# Patient Record
Sex: Male | Born: 1955 | Race: Black or African American | Hispanic: No | Smoking: Current every day smoker
Health system: Southern US, Community
[De-identification: ages and names within clinical notes are randomized; demographics above are authoritative.]

## PROBLEM LIST (undated history)

## (undated) DIAGNOSIS — I10 Essential (primary) hypertension: Secondary | ICD-10-CM

## (undated) DIAGNOSIS — E7800 Pure hypercholesterolemia, unspecified: Secondary | ICD-10-CM

## (undated) DIAGNOSIS — F32A Depression, unspecified: Secondary | ICD-10-CM

## (undated) DIAGNOSIS — I639 Cerebral infarction, unspecified: Secondary | ICD-10-CM

## (undated) DIAGNOSIS — M1990 Unspecified osteoarthritis, unspecified site: Secondary | ICD-10-CM

## (undated) DIAGNOSIS — F329 Major depressive disorder, single episode, unspecified: Secondary | ICD-10-CM

## (undated) HISTORY — PX: KNEE SURGERY: SHX244

---

## 2001-03-15 DIAGNOSIS — I639 Cerebral infarction, unspecified: Secondary | ICD-10-CM

## 2001-03-15 HISTORY — DX: Cerebral infarction, unspecified: I63.9

## 2008-04-18 ENCOUNTER — Emergency Department (HOSPITAL_COMMUNITY): Admission: EM | Admit: 2008-04-18 | Discharge: 2008-04-18 | Payer: Self-pay | Admitting: Emergency Medicine

## 2008-04-30 ENCOUNTER — Ambulatory Visit (HOSPITAL_COMMUNITY): Admission: RE | Admit: 2008-04-30 | Discharge: 2008-04-30 | Payer: Self-pay | Admitting: Orthopaedic Surgery

## 2008-11-23 ENCOUNTER — Emergency Department (HOSPITAL_COMMUNITY): Admission: EM | Admit: 2008-11-23 | Discharge: 2008-11-23 | Payer: Self-pay | Admitting: Emergency Medicine

## 2010-06-30 LAB — COMPREHENSIVE METABOLIC PANEL
ALT: 33 U/L (ref 0–53)
AST: 22 U/L (ref 0–37)
Alkaline Phosphatase: 91 U/L (ref 39–117)
CO2: 28 mEq/L (ref 19–32)
GFR calc non Af Amer: 59 mL/min — ABNORMAL LOW (ref >60)
Glucose, Bld: 110 mg/dL — ABNORMAL HIGH (ref 70–99)
Potassium: 4.4 mEq/L (ref 3.5–5.1)
Sodium: 138 mEq/L (ref 135–145)

## 2010-06-30 LAB — URINALYSIS, ROUTINE W REFLEX MICROSCOPIC
Hgb urine dipstick: NEGATIVE
Ketones, ur: NEGATIVE mg/dL
Protein, ur: NEGATIVE mg/dL
Urobilinogen, UA: 0.2 mg/dL (ref 0.0–1.0)

## 2010-06-30 LAB — CBC
Hemoglobin: 14.4 g/dL (ref 13.0–17.0)
RBC: 4.66 MIL/uL (ref 4.22–5.81)
WBC: 5.6 10*3/uL (ref 4.0–10.5)

## 2010-06-30 LAB — DIFFERENTIAL
Basophils Relative: 1 % (ref 0–1)
Eosinophils Absolute: 0.1 10*3/uL (ref 0.0–0.7)
Eosinophils Relative: 3 % (ref 0–5)
Monocytes Relative: 6 % (ref 3–12)
Neutrophils Relative %: 40 % — ABNORMAL LOW (ref 43–77)

## 2010-07-28 NOTE — H&P (Signed)
NAMEJACQUELINE, Evan Rios               ACCOUNT NO.:  [id number]   MEDICAL RECORD NO.:  [id number]          PATIENT TYPE:  AMB   LOCATION:  DAY                           FACILITY:  [facility]   PHYSICIAN:  J. Darreld Mclean, M.D. DATE OF BIRTH:  08-Apr-1955   DATE OF ADMISSION:  DATE OF DISCHARGE:  LH                              HISTORY & PHYSICAL   CHIEF COMPLAINT:  I got a piece of metal in my knee.   The patient is a 55 year old male who hurt his right knee approximately  3-4 months ago.  He was helping family members cut wood.  They were  using a heavy maul.  As one of his relatives took the maul, he was  sitting at distance away.  They hit a piece of metal when they were  striking the wood and a piece of metal flew off, went over and  penetrated his right knee medially.  He had pain and tenderness.  A  piece of metal went in the knee and has been there since then.  Some  bone has been tender.  At the time he did not seek medical attention.  He thought it would get better, put peroxide on it.  It is moving around  in the leg.  It  hurts.  He came to the hospital on April 18, 2008 and  was seen.  No x-rays were taken and he said there was no infection.  He  asked that I see him.  He has had pain medially.  He would like to have  the piece of metal removed.  It is very painful.   PAST MEDICAL HISTORY:  He has a history of hypertension and history of  stroke and circulatory problems.   Denies any allergies.   He smokes and does not use alcoholic beverages.   MEDICATIONS:  1. He takes aspirin 81 mg daily.  2. Citalopram 20 mg daily.  3. Amlodipine 10 mg daily.  4. Seroquel 200 mg daily.  5. Folic acid daily.  6. Ibuprofen 600 mg milligrams three times a day  7. He also has Vicodin.   PREVIOUS SURGERY:  He had a stroke and had to have surgery for that in  2003 at [facility].  Because of that, he is on disability and has  Medicare.  His father is at [facility].   Hypertension,  diabetes, heart trouble run in the family.  He is divorced and [place]   PHYSICAL EXAM:  Patient is alert, cooperative, oriented.  VITAL SIGNS:  Height is 5 feet 10-1/2, weight is 193 pounds.  Pulse is  80, BP is 100/80, respirations 20, he is afebrile.   HEENT: Negative.  NECK:  Supple.  LUNGS: Clear to P and A.  HEART: Regular without murmur heard.  ABDOMEN:  Soft, nontender without masses.  EXTREMITIES:  His right leg medially, there is a well-healed incision  just below the joint line.  This is an area of tenderness in it feels  like there is a foreign object present near there, but it is  about a  centimeter inferior and slightly posterior to the original incision  line.  Range of motion of the knee is full but tender.  Gait is normal.  Other extremities are negative.  CNS: Intact.  SKIN:  Intact.   IMPRESSION:  Foreign body, right knee area.   I discussed with the patient the removal of this.  Obtained x-rays in  the office, showing the foreign body.  There is no evidence of any  infection.  This will be done in the OR with fluoroscopy control.  I  explained to him the risks and imponderables.  He appears to understand.  Labs pending.                                            ______________________________  Shela Commons. Darreld Mclean, M.D.     JWK/MEDQ  D:  04/24/2008  T:  04/24/2008  Job:  [id number]

## 2010-07-28 NOTE — Op Note (Signed)
NAMEPRESTON, Evan Rios               ACCOUNT NO.:  [id number]   MEDICAL RECORD NO.:  [id number]          PATIENT TYPE:  AMB   LOCATION:  DAY                           FACILITY:  [facility]   PHYSICIAN:  J. Darreld Mclean, M.D. DATE OF BIRTH:  1956-03-06   DATE OF PROCEDURE:  DATE OF DISCHARGE:                               OPERATIVE REPORT   PREOPERATIVE DIAGNOSIS:  Foreign body, right medial knee.   POSTOPERATIVE DIAGNOSIS:  Foreign body, right medial knee.   PROCEDURE:  Removal of foreign body, right medial knee.   ANESTHESIA:  General.   TOURNIQUET TIME:  12 minutes.   SURGEON:  J. Darreld Mclean, MD   DRAINS:  None.   INDICATIONS:  The patient is a 55 year old male who was cutting wood, a  piece of metal was hitten by the wood maul.  A piece of metal entered in  his knee on the medial aspect.  He was seen in the emergency room on  February 4.  The injury actually occurred about 3 or 4 months ago.  He  went to the ER and no x-rays were taken.  When he came to my office few  days later, so he got an x-ray.  There was a piece of metal in the  medial aspect of his knee.  It is slightly movable.  I saw him in the  office on the 10th, we scheduled surgery date as 16th.  This was  earliest time he could obtain a surgery.  Risks and imponderables of the  procedure were discussed.   DESCRIPTION OF PROCEDURE:  The patient identified the right knee as  correct surgical site and he marked where the foreign body was.  I  marked the right knee and put a circle around where we thought the  foreign body was.  He was taken to the operating room, given general  anesthesia while in supine.  Tourniquet was placed and deflated the  right upper thigh.  After generalized time-out, I identified the patient  as Mr. Baranowski.  Identified the right knee as the correct surgical site  and we had all instrumentation being properly positioned.   Leg was elevated and wrapped circumferentially with an Esmarch  bandage.  Tourniquet was inflated to 300 mmHg.  Esmarch bandage was removed.  Incision was made over the lesion.  It appeared that the foreign body  was deep, but appeared to be walled off.  Foreign body was removed.  He  had a deep cover of fibrous tissue around it.  It was opened.  There was  no purulence in the wound.  His foreign body was removed.  It measured  approximately 0.5 cm x 1 cm.  Foreign body will be given to his family  after being cleansed and disinfected.  The wound was reapproximated  after irrigation with saline with 2-0 chromic and then skin staples.  Sterile dressing applied.  Bulky  dressing applied.  Tourniquet deflated after 12 minutes.  The patient  has prescription of Norco 7.5/325 for pain.  I will see him in my office  in approximately 10  days to 2 weeks.  If any difficulties, always  contact me through the office hospital beeper system.           ______________________________  Shela Commons. Darreld Mclean, M.D.     JWK/MEDQ  D:  04/30/2008  T:  04/30/2008  Job:  [id number]

## 2011-11-23 ENCOUNTER — Emergency Department (HOSPITAL_COMMUNITY)
Admission: EM | Admit: 2011-11-23 | Discharge: 2011-11-23 | Disposition: A | Discharge status: Home / Self Care | Payer: Medicare Other | Attending: Emergency Medicine | Admitting: Emergency Medicine

## 2011-11-23 ENCOUNTER — Emergency Department (HOSPITAL_COMMUNITY): Payer: Medicare Other

## 2011-11-23 ENCOUNTER — Encounter (HOSPITAL_COMMUNITY): Payer: Self-pay | Admitting: *Deleted

## 2011-11-23 DIAGNOSIS — M25562 Pain in left knee: Secondary | ICD-10-CM

## 2011-11-23 DIAGNOSIS — M25462 Effusion, left knee: Secondary | ICD-10-CM

## 2011-11-23 DIAGNOSIS — M25469 Effusion, unspecified knee: Secondary | ICD-10-CM | POA: Insufficient documentation

## 2011-11-23 DIAGNOSIS — M25569 Pain in unspecified knee: Secondary | ICD-10-CM | POA: Insufficient documentation

## 2011-11-23 HISTORY — DX: Essential (primary) hypertension: I10

## 2011-11-23 HISTORY — DX: Major depressive disorder, single episode, unspecified: F32.9

## 2011-11-23 HISTORY — DX: Depression, unspecified: F32.A

## 2011-11-23 MED ORDER — BUPIVACAINE HCL (PF) 0.5 % IJ SOLN
10.0000 mL | Freq: Once | INTRAMUSCULAR | Status: DC
  Filled 2011-11-23: qty 30

## 2011-11-23 MED ORDER — HYDROCODONE-ACETAMINOPHEN 5-500 MG PO TABS: 1.0000 | ORAL_TABLET | Freq: Four times a day (QID) | ORAL | Status: AC | PRN

## 2011-11-23 MED ORDER — NAPROXEN 500 MG PO TABS: 500.0000 mg | ORAL_TABLET | Freq: Two times a day (BID) | ORAL | Status: DC | PRN

## 2011-11-23 MED ORDER — HYDROCODONE-ACETAMINOPHEN 5-325 MG PO TABS
2.0000 | ORAL_TABLET | Freq: Once | ORAL | Status: AC
  Administered 2011-11-23: 2 via ORAL
  Filled 2011-11-23: qty 2

## 2011-11-23 MED ORDER — IBUPROFEN 800 MG PO TABS
800.0000 mg | ORAL_TABLET | Freq: Once | ORAL | Status: AC
  Administered 2011-11-23: 800 mg via ORAL
  Filled 2011-11-23: qty 1

## 2011-11-23 NOTE — ED Provider Notes (Signed)
History  This chart was scribed for Evan Razor, MD by Erskine Emery. This patient was seen in room APFT24/APFT24 and the patient's care was started at 17:55.   CSN: [id number]  Arrival date & time 11/23/11  1651   First MD Initiated Contact with Patient 11/23/11 1755      Chief Complaint  Patient presents with  . Knee Pain    (Consider location/radiation/quality/duration/timing/severity/associated sxs/prior treatment) The history is provided by the patient. No language interpreter was used.  Evan Rios is a 56 y.o. male who presents to the Emergency Department complaining of gradually worsening constant left knee pain that shoots through the calf, aggravated by walking or standing, since waking this morning. Pt reports he was fine before going to bed. Pt has a h/o surgery in that knee 2 years ago for torn cartilage. Pt denies any fevers or chills but reports the knee seems swollen. Pt reports the only time he has had knee pain this bad was before the surgery. Pt denies any injury or recent activity changes. Pt has no h/o gout and has NKDA.   Past Medical History  Diagnosis Date  . Hypertension   . Depression     Past Surgical History  Procedure Date  . Knee surgery     No family history on file.  History  Substance Use Topics  . Smoking status: Current Everyday Smoker  . Smokeless tobacco: Not on file  . Alcohol Use: No      Review of Systems  Constitutional: Negative for fever and chills.  Respiratory: Negative for shortness of breath.   Gastrointestinal: Negative for nausea and vomiting.  Musculoskeletal: Positive for joint swelling.       Left knee pain  Neurological: Negative for weakness.    Allergies  Bee venom and Oxycodone  Home Medications   Current Outpatient Rx  Name Route Sig Dispense Refill  . AMLODIPINE BESY-BENAZEPRIL HCL 10-20 MG PO CAPS Oral Take 1 capsule by mouth daily.    Marland Kitchen CITALOPRAM HYDROBROMIDE 20 MG PO TABS Oral Take 20 mg by  mouth daily.      BP 150/83  Pulse 92  Temp 98.7 F (37.1 C)  Resp 18  Ht 5\' 11"  (1.803 m)  Wt 220 lb (99.791 kg)  BMI 30.68 kg/m2  SpO2 97%  Physical Exam  Nursing note and vitals reviewed. Constitutional: He is oriented to person, place, and time. He appears well-developed and well-nourished. No distress.  HENT:  Head: Normocephalic and atraumatic.  Eyes: EOM are normal.  Neck: Neck supple. No tracheal deviation present.  Cardiovascular: Normal rate.   Pulmonary/Chest: Effort normal. No respiratory distress.  Musculoskeletal: Normal range of motion. He exhibits tenderness. He exhibits no edema.       Left knee is normal in appearance and symmetric compared with the right knee. No effusion appreciated.  Pain with manipulation of patella. Tenderness in popliteal fossa. Neurovascular intact distally.  Neurological: He is alert and oriented to person, place, and time.  Skin: Skin is warm and dry. No rash noted. No erythema.  Psychiatric: He has a normal mood and affect. His behavior is normal.    ED Course  Procedures (including critical care time) DIAGNOSTIC STUDIES: Oxygen Saturation is 97% on room air, adequate by my interpretation.    COORDINATION OF CARE: 18:04--I evaluated the patient and we discussed a treatment plan including pain medication to which the pt agreed. The pt requested pills, not pain medication injection.   Labs Reviewed - No data  to display No results found.   1. Knee pain, left   2. Knee effusion, left       MDM  56yM male with atraumatic left knee pain. Patient has no effusion. I wanted to This. Patient is refusing. I discussed with him that I cannot adequately evaluate for potential etiologies of this, the worst of which would be a septic joint. Her stance the benefit of this evaluation. Understands risk of not undergoing this procedure. Will treat him symptomatically at this time. Return precautions were discussed.      I personally  preformed the services scribed in my presence. The recorded information has been reviewed and considered. Evan Razor, MD. }   Evan Razor, MD 11/30/11 [phone]

## 2011-11-23 NOTE — ED Notes (Signed)
Pt c/o pain to left knee, states that he noticed that his left knee felt "achey" over the past few days but the pain was worse after he woke up this afternoon, denies any injury.

## 2011-11-23 NOTE — ED Notes (Signed)
MD at bedside. 

## 2012-03-17 ENCOUNTER — Emergency Department (HOSPITAL_COMMUNITY): Payer: Medicare Other

## 2012-03-17 ENCOUNTER — Emergency Department (HOSPITAL_COMMUNITY)
Admission: EM | Admit: 2012-03-17 | Discharge: 2012-03-17 | Disposition: A | Discharge status: Home / Self Care | Payer: Medicare Other | Attending: Emergency Medicine | Admitting: Emergency Medicine

## 2012-03-17 ENCOUNTER — Encounter (HOSPITAL_COMMUNITY): Payer: Self-pay

## 2012-03-17 DIAGNOSIS — R1310 Dysphagia, unspecified: Secondary | ICD-10-CM | POA: Insufficient documentation

## 2012-03-17 DIAGNOSIS — F3289 Other specified depressive episodes: Secondary | ICD-10-CM | POA: Insufficient documentation

## 2012-03-17 DIAGNOSIS — K047 Periapical abscess without sinus: Secondary | ICD-10-CM | POA: Insufficient documentation

## 2012-03-17 DIAGNOSIS — F17200 Nicotine dependence, unspecified, uncomplicated: Secondary | ICD-10-CM | POA: Insufficient documentation

## 2012-03-17 DIAGNOSIS — F329 Major depressive disorder, single episode, unspecified: Secondary | ICD-10-CM | POA: Insufficient documentation

## 2012-03-17 DIAGNOSIS — I10 Essential (primary) hypertension: Secondary | ICD-10-CM | POA: Insufficient documentation

## 2012-03-17 DIAGNOSIS — K122 Cellulitis and abscess of mouth: Secondary | ICD-10-CM

## 2012-03-17 DIAGNOSIS — Z79899 Other long term (current) drug therapy: Secondary | ICD-10-CM | POA: Insufficient documentation

## 2012-03-17 LAB — CBC WITH DIFFERENTIAL/PLATELET
Basophils Absolute: 0 10*3/uL (ref 0.0–0.1)
Basophils Relative: 0 % (ref 0–1)
Eosinophils Absolute: 0.1 10*3/uL (ref 0.0–0.7)
Eosinophils Relative: 2 % (ref 0–5)
HCT: 42 % (ref 39.0–52.0)
Lymphocytes Relative: 46 % (ref 12–46)
MCH: 30.3 pg (ref 26.0–34.0)
MCHC: 34.5 g/dL (ref 30.0–36.0)
MCV: 87.7 fL (ref 78.0–100.0)
Monocytes Absolute: 0.7 10*3/uL (ref 0.1–1.0)
Platelets: 367 10*3/uL (ref 150–400)
RDW: 13 % (ref 11.5–15.5)
WBC: 9.4 10*3/uL (ref 4.0–10.5)

## 2012-03-17 LAB — BASIC METABOLIC PANEL
Calcium: 9.6 mg/dL (ref 8.4–10.5)
Creatinine, Ser: 1.07 mg/dL (ref 0.50–1.35)
GFR calc Af Amer: 88 mL/min — ABNORMAL LOW (ref >90)
GFR calc non Af Amer: 76 mL/min — ABNORMAL LOW (ref >90)
Sodium: 137 mEq/L (ref 135–145)

## 2012-03-17 LAB — RAPID STREP SCREEN (MED CTR MEBANE ONLY): Streptococcus, Group A Screen (Direct): NEGATIVE

## 2012-03-17 MED ORDER — AMOXICILLIN 500 MG PO CAPS: 500.0000 mg | ORAL_CAPSULE | Freq: Three times a day (TID) | ORAL | Status: DC

## 2012-03-17 MED ORDER — DEXAMETHASONE SODIUM PHOSPHATE 4 MG/ML IJ SOLN
10.0000 mg | Freq: Once | INTRAMUSCULAR | Status: AC
  Administered 2012-03-17: 10 mg via INTRAVENOUS
  Filled 2012-03-17: qty 3

## 2012-03-17 MED ORDER — IOHEXOL 300 MG/ML  SOLN
80.0000 mL | Freq: Once | INTRAMUSCULAR | Status: AC | PRN
  Administered 2012-03-17: 80 mL via INTRAVENOUS

## 2012-03-17 MED ORDER — SODIUM CHLORIDE 0.9 % IV BOLUS (SEPSIS)
1000.0000 mL | Freq: Once | INTRAVENOUS | Status: AC
  Administered 2012-03-17: 1000 mL via INTRAVENOUS

## 2012-03-17 MED ORDER — SODIUM CHLORIDE 0.9 % IV SOLN
3.0000 g | Freq: Once | INTRAVENOUS | Status: AC
  Administered 2012-03-17: 3 g via INTRAVENOUS
  Filled 2012-03-17: qty 3

## 2012-03-17 MED ORDER — HYDROCODONE-ACETAMINOPHEN 7.5-500 MG/15ML PO SOLN: 15.0000 mL | Freq: Four times a day (QID) | ORAL | Status: DC | PRN

## 2012-03-17 NOTE — ED Notes (Signed)
Pt tolerated orange juice well.

## 2012-03-17 NOTE — ED Notes (Signed)
Pt states he awoke this am with feeling of difficulty swallowing, also throat is sore now.   No resp diff.

## 2012-03-17 NOTE — ED Provider Notes (Signed)
History   This chart was scribed for Glynn Octave, MD by Gerlean Ren, ED Scribe. This patient was seen in room APA11/APA11 and the patient's care was started at 9:04 PM    CSN: [id number]  Arrival date & time 03/17/12  2048   First MD Initiated Contact with Patient 03/17/12 2056      Chief Complaint  Patient presents with  . Sore Throat  . Dysphagia     The history is provided by the patient. No language interpreter was used.   Evan Rios is a 57 y.o. male with h/o HTN and depression who presents to the Emergency Department complaining of a constant sore throat with sudden onset this morning.  Pt suddenly woke up choking on phlegm but was able to catch his breath after sitting up and spitting out phlegm, which contained a small amount of blood.  Pt states he has been gagging up phlegm throughout the day that causes temporary dyspnea.  Pt states he was able to eat and drink regularly today.  Pt denies abdominal pain, dental pain.  Pt states he is currently on amoxicillin for a dental abscess identified one week ago.  Pt is a current everyday smoker but denies alcohol use.   PCP at [facility]. Past Medical History  Diagnosis Date  . Hypertension   . Depression     Past Surgical History  Procedure Date  . Knee surgery     No family history on file.  History  Substance Use Topics  . Smoking status: Current Every Day Smoker  . Smokeless tobacco: Not on file  . Alcohol Use: No      Review of Systems A complete 10 system review of systems was obtained and all systems are negative except as noted in the HPI and PMH.  Allergies  Bee venom and Oxycodone  Home Medications   Current Outpatient Rx  Name  Route  Sig  Dispense  Refill  . AMLODIPINE BESY-BENAZEPRIL HCL 10-20 MG PO CAPS   Oral   Take 1 capsule by mouth daily.         Marland Kitchen CITALOPRAM HYDROBROMIDE 20 MG PO TABS   Oral   Take 20 mg by mouth daily.         Marland Kitchen NAPROXEN 500 MG PO TABS   Oral   Take 1 tablet  (500 mg total) by mouth 2 (two) times daily as needed.   20 tablet   0     BP 178/107  Pulse 118  Temp 98 F (36.7 C) (Oral)  Resp 20  Ht 5\' 11"  (1.803 m)  Wt 205 lb (92.987 kg)  BMI 28.59 kg/m2  SpO2 98%  Physical Exam  Nursing note and vitals reviewed. Constitutional: He is oriented to person, place, and time. He appears well-developed and well-nourished. No distress.  HENT:  Head: Normocephalic and atraumatic.       Hoarse voice, bilateral tonsillar erythema and swelling right greater than left, no soft palette swelling, uvula midline, no trismus, no drooling,   Eyes: Conjunctivae normal are normal.  Neck: No tracheal deviation present.       Shotty cervical lymphadenopathy,  Cardiovascular: Normal rate, regular rhythm and normal heart sounds.   No murmur heard. Pulmonary/Chest: Effort normal and breath sounds normal. No respiratory distress. He has no wheezes.  Abdominal: Soft. Bowel sounds are normal. There is no tenderness.  Musculoskeletal: Normal range of motion.  Neurological: He is alert and oriented to person, place, and time.  CN 2-12 intact, smile symmetric, 5/5 strength throughout, no ataxia on finger to nose.  Skin: Skin is warm and dry.  Psychiatric: He has a normal mood and affect. His behavior is normal.    ED Course  Procedures (including critical care time) DIAGNOSTIC STUDIES: Oxygen Saturation is 98% on room air, normal by my interpretation.    COORDINATION OF CARE: 9:09 PM- Patient informed of clinical course, understands medical decision-making process, and agrees with plan.  Ordered IV fluids, IV Unasyn, IV decadron, CBC, b-met, and soft tissue neck CT with contrast. 10:41 PM- Re-check, informed pt of negative XRs and that pharyngitis is most likely viral.   Informed pt to return if condition worsens or if drooling occurs.    Results for orders placed during the hospital encounter of 03/17/12  CBC WITH DIFFERENTIAL      Component Value Range    WBC 9.4  4.0 - 10.5 K/uL   RBC 4.79  4.22 - 5.81 MIL/uL   Hemoglobin 14.5  13.0 - 17.0 g/dL   HCT 14.7  82.9 - 56.2 %   MCV 87.7  78.0 - 100.0 fL   MCH 30.3  26.0 - 34.0 pg   MCHC 34.5  30.0 - 36.0 g/dL   RDW 13.0  86.5 - 78.4 %   Platelets 367  150 - 400 K/uL   Neutrophils Relative 44  43 - 77 %   Neutro Abs 4.1  1.7 - 7.7 K/uL   Lymphocytes Relative 46  12 - 46 %   Lymphs Abs 4.4 (*) 0.7 - 4.0 K/uL   Monocytes Relative 8  3 - 12 %   Monocytes Absolute 0.7  0.1 - 1.0 K/uL   Eosinophils Relative 2  0 - 5 %   Eosinophils Absolute 0.1  0.0 - 0.7 K/uL   Basophils Relative 0  0 - 1 %   Basophils Absolute 0.0  0.0 - 0.1 K/uL  BASIC METABOLIC PANEL      Component Value Range   Sodium 137  135 - 145 mEq/L   Potassium 3.6  3.5 - 5.1 mEq/L   Chloride 101  96 - 112 mEq/L   CO2 25  19 - 32 mEq/L   Glucose, Bld 137 (*) 70 - 99 mg/dL   BUN 14  6 - 23 mg/dL   Creatinine, Ser 6.96  0.50 - 1.35 mg/dL   Calcium 9.6  8.4 - 29.5 mg/dL   GFR calc non Af Amer 76 (*) >90 mL/min   GFR calc Af Amer 88 (*) >90 mL/min  RAPID STREP SCREEN      Component Value Range   Streptococcus, Group A Screen (Direct) NEGATIVE  NEGATIVE    Dg Neck Soft Tissue  03/17/2012  *RADIOLOGY REPORT*  Clinical Data: Smoker with dysphagia.  NECK SOFT TISSUES - 1+ VIEW  Comparison: None.  Findings: Bilateral carotid artery calcifications.  Posterior soft tissue calcification.  Mild cervical spine degenerative changes and mild reversal of the normal cervical lordosis with mild dextroconvex scoliosis.  Normal appearing epiglottis and subglottic airway.  No prevertebral soft tissue swelling.  Possible enlargement of the lingual tonsils.  IMPRESSION:  1.  Possible lingual tonsillar enlargement.  Correlation with direct visualization is recommended. 2.  Bilateral carotid artery atheromatous calcifications.   Original Report Authenticated By: Beckie Salts, M.D.    Dg Chest 2 View  03/17/2012  *RADIOLOGY REPORT*  Clinical Data: Sore  throat, dysphagia, pain with swallowing, history hypertension, smoking  CHEST - 2 VIEW  Comparison: None.  Findings: Normal heart size, mediastinal contours, and pulmonary vascularity. Bibasilar opacities on the PA view, not well visualized on lateral view, suspect mild bibasilar atelectasis. No definite infiltrate, pleural effusion or pneumothorax. Bones unremarkable.  IMPRESSION: Probable bibasilar atelectasis.   Original Report Authenticated By: Ulyses Southward, M.D.    Ct Soft Tissue Neck W Contrast  03/17/2012  *RADIOLOGY REPORT*  Clinical Data: Pharyngitis and dysphagia.  Smoker  CT NECK WITH CONTRAST  Technique:  Multidetector CT imaging of the neck was performed with intravenous contrast.  Contrast: 80mL OMNIPAQUE IOHEXOL 300 MG/ML  SOLN  Comparison: Soft tissue neck radiographs obtained earlier today.  Findings: Normal appearing prevertebral soft tissues and airway. The epiglottis has a normal appearance.  No masses, enlarged lymph nodes or fluid collections.  Mild cervical spine degenerative changes and reversal of the normal cervical lordosis.  Previously noted bilateral carotid artery calcifications.  Bullous changes at both lung apices.  IMPRESSION:  1.  No acute abnormality. 2.  Bilateral carotid artery atheromatous calcifications. 3.  COPD.   Original Report Authenticated By: Beckie Salts, M.D.      No diagnosis found.    MDM  Awoke this morning with "feeling of choking" and sore throat. Worse with laying down, better with sitting up. No drooling difficulty breathing or swallowing. No fever.  Erythematous oropharynx with enlarged tonsils R>L.  No exudates. Uvula midline and edematous. Imaging negative for peritonsillar abscess. Will treat for uvulitis.  Tolerating PO in ED.  I personally performed the services described in this documentation, which was scribed in my presence. The recorded information has been reviewed and is accurate.   Glynn Octave, MD 03/17/12 [phone]

## 2012-03-17 NOTE — ED Notes (Signed)
Visualized pt to have swollen and red tonsils worse in his right.

## 2012-03-17 NOTE — ED Notes (Signed)
Pt presents with sore throat and dysphagia since this morning. Pt states pain in throat and swallowing has worsened throughout the day. Pt denies fever. NAD noted at this time.

## 2013-01-17 ENCOUNTER — Emergency Department (HOSPITAL_COMMUNITY)
Admission: EM | Admit: 2013-01-17 | Discharge: 2013-01-17 | Disposition: A | Discharge status: Home / Self Care | Payer: Medicare Other | Attending: Emergency Medicine | Admitting: Emergency Medicine

## 2013-01-17 ENCOUNTER — Encounter (HOSPITAL_COMMUNITY): Payer: Self-pay | Admitting: Emergency Medicine

## 2013-01-17 DIAGNOSIS — Z79899 Other long term (current) drug therapy: Secondary | ICD-10-CM | POA: Insufficient documentation

## 2013-01-17 DIAGNOSIS — K089 Disorder of teeth and supporting structures, unspecified: Secondary | ICD-10-CM | POA: Insufficient documentation

## 2013-01-17 DIAGNOSIS — E7800 Pure hypercholesterolemia, unspecified: Secondary | ICD-10-CM | POA: Insufficient documentation

## 2013-01-17 DIAGNOSIS — K029 Dental caries, unspecified: Secondary | ICD-10-CM | POA: Insufficient documentation

## 2013-01-17 DIAGNOSIS — K0889 Other specified disorders of teeth and supporting structures: Secondary | ICD-10-CM

## 2013-01-17 DIAGNOSIS — F3289 Other specified depressive episodes: Secondary | ICD-10-CM | POA: Insufficient documentation

## 2013-01-17 DIAGNOSIS — I10 Essential (primary) hypertension: Secondary | ICD-10-CM | POA: Insufficient documentation

## 2013-01-17 DIAGNOSIS — F329 Major depressive disorder, single episode, unspecified: Secondary | ICD-10-CM | POA: Insufficient documentation

## 2013-01-17 DIAGNOSIS — Z8673 Personal history of transient ischemic attack (TIA), and cerebral infarction without residual deficits: Secondary | ICD-10-CM | POA: Insufficient documentation

## 2013-01-17 DIAGNOSIS — F17200 Nicotine dependence, unspecified, uncomplicated: Secondary | ICD-10-CM | POA: Insufficient documentation

## 2013-01-17 HISTORY — DX: Pure hypercholesterolemia, unspecified: E78.00

## 2013-01-17 HISTORY — DX: Cerebral infarction, unspecified: I63.9

## 2013-01-17 MED ORDER — AMOXICILLIN 500 MG PO CAPS: 500.0000 mg | ORAL_CAPSULE | Freq: Three times a day (TID) | ORAL | Status: DC

## 2013-01-17 MED ORDER — TRAMADOL HCL 50 MG PO TABS: 50.0000 mg | ORAL_TABLET | Freq: Four times a day (QID) | ORAL | Status: DC | PRN

## 2013-01-17 NOTE — ED Provider Notes (Signed)
CSN: [id number]     Arrival date & time 01/17/13  1428 History   First MD Initiated Contact with Patient 01/17/13 1515     Chief Complaint  Patient presents with  . Dental Pain   (Consider location/radiation/quality/duration/timing/severity/associated sxs/prior Treatment) Patient is a 57 y.o. male presenting with tooth pain. The history is provided by the patient.  Dental Pain Location:  Lower Lower teeth location:  29/RL 2nd bicuspid, 30/RL 1st molar and 31/RL 2nd molar Quality:  Sharp and shooting Severity:  Severe Onset quality:  Gradual Duration:  2 days Timing:  Constant Progression:  Worsening Chronicity:  New Context: dental caries and poor dentition   Context: not recent dental surgery and not trauma   Relieved by:  Heat Worsened by:  Cold food/drink Ineffective treatments:  NSAIDs Associated symptoms: facial pain   Associated symptoms: no congestion, no difficulty swallowing, no drooling, no facial swelling, no fever, no gum swelling, no headaches, no neck pain, no neck swelling and no trismus   Risk factors: lack of dental care, periodontal disease and smoking     Past Medical History  Diagnosis Date  . Hypertension   . Depression   . Hypercholesteremia   . Stroke    Past Surgical History  Procedure Laterality Date  . Knee surgery     Family History  Problem Relation Age of Onset  . Stroke Mother   . Cancer Mother   . Hypertension Father    History  Substance Use Topics  . Smoking status: Current Every Day Smoker -- 1.00 packs/day for 41 years    Types: Cigarettes  . Smokeless tobacco: Former Neurosurgeon  . Alcohol Use: Yes     Comment: occas    Review of Systems  Constitutional: Negative for fever and appetite change.  HENT: Positive for dental problem. Negative for congestion, drooling, facial swelling, sore throat and trouble swallowing.   Eyes: Negative for pain and visual disturbance.  Musculoskeletal: Negative for neck pain and neck stiffness.   Neurological: Negative for dizziness, facial asymmetry and headaches.  Hematological: Negative for adenopathy.  All other systems reviewed and are negative.    Allergies  Bee venom and Oxycodone  Home Medications   Current Outpatient Rx  Name  Route  Sig  Dispense  Refill  . amLODipine-benazepril (LOTREL) 10-20 MG per capsule   Oral   Take 1 capsule by mouth daily.         . citalopram (CELEXA) 20 MG tablet   Oral   Take 20 mg by mouth daily.         . pravastatin (PRAVACHOL) 40 MG tablet   Oral   Take 40 mg by mouth daily.         . traZODone (DESYREL) 100 MG tablet   Oral   Take 100 mg by mouth at bedtime.          BP 140/77  Pulse 95  Temp(Src) 98.4 F (36.9 C) (Oral)  Resp 16  Ht 5\' 11"  (1.803 m)  Wt 205 lb (92.987 kg)  BMI 28.60 kg/m2  SpO2 99% Physical Exam  Nursing note and vitals reviewed. Constitutional: He is oriented to person, place, and time. He appears well-developed and well-nourished. No distress.  HENT:  Head: Normocephalic and atraumatic.  Right Ear: Tympanic membrane and ear canal normal.  Left Ear: Tympanic membrane and ear canal normal.  Mouth/Throat: Uvula is midline, oropharynx is clear and moist and mucous membranes are normal. No trismus in the jaw. Dental caries  present. No dental abscesses or uvula swelling.  Dental caries of the left lower molars and second biscuspid.  No facial swelling, obvious dental abscess, trismus, or sublingual abnml.    Neck: Normal range of motion. Neck supple.  Cardiovascular: Normal rate, regular rhythm and normal heart sounds.   No murmur heard. Pulmonary/Chest: Effort normal and breath sounds normal. No respiratory distress.  Musculoskeletal: Normal range of motion.  Lymphadenopathy:    He has no cervical adenopathy.  Neurological: He is alert and oriented to person, place, and time. He exhibits normal muscle tone. Coordination normal.  Skin: Skin is warm and dry.    ED Course  Procedures  (including critical care time) Labs Review Labs Reviewed - No data to display Imaging Review No results found.  EKG Interpretation   None       MDM    Multiple dental caries with tenderness to palpation along the second left lower premolar and first and second molars.  No concerning sx's for Ludwig's angina.  Pt appears stable for discharge.  Referral info given for local dentists  Chieko Neises L. Trisha Mangle, PA-C 01/18/13 2239

## 2013-01-17 NOTE — ED Notes (Signed)
Pt c/o R lower jaw dental pain.

## 2013-01-23 NOTE — ED Provider Notes (Signed)
Medical screening examination/treatment/procedure(s) were performed by non-physician practitioner and as supervising physician I was immediately available for consultation/collaboration.  EKG Interpretation   None        Oswaldo Cueto M Emalene Welte, MD 01/23/13 0905 

## 2013-08-15 ENCOUNTER — Encounter (HOSPITAL_COMMUNITY): Payer: Self-pay | Admitting: Emergency Medicine

## 2013-08-15 ENCOUNTER — Emergency Department (HOSPITAL_COMMUNITY)
Admission: EM | Admit: 2013-08-15 | Discharge: 2013-08-15 | Disposition: A | Discharge status: Home / Self Care | Payer: Medicare Other | Attending: Emergency Medicine | Admitting: Emergency Medicine

## 2013-08-15 DIAGNOSIS — R109 Unspecified abdominal pain: Secondary | ICD-10-CM | POA: Insufficient documentation

## 2013-08-15 DIAGNOSIS — Z7982 Long term (current) use of aspirin: Secondary | ICD-10-CM | POA: Insufficient documentation

## 2013-08-15 DIAGNOSIS — F329 Major depressive disorder, single episode, unspecified: Secondary | ICD-10-CM | POA: Insufficient documentation

## 2013-08-15 DIAGNOSIS — F3289 Other specified depressive episodes: Secondary | ICD-10-CM | POA: Insufficient documentation

## 2013-08-15 DIAGNOSIS — Z8673 Personal history of transient ischemic attack (TIA), and cerebral infarction without residual deficits: Secondary | ICD-10-CM | POA: Insufficient documentation

## 2013-08-15 DIAGNOSIS — E7800 Pure hypercholesterolemia, unspecified: Secondary | ICD-10-CM | POA: Insufficient documentation

## 2013-08-15 DIAGNOSIS — F17200 Nicotine dependence, unspecified, uncomplicated: Secondary | ICD-10-CM | POA: Insufficient documentation

## 2013-08-15 DIAGNOSIS — Z79899 Other long term (current) drug therapy: Secondary | ICD-10-CM | POA: Insufficient documentation

## 2013-08-15 DIAGNOSIS — I10 Essential (primary) hypertension: Secondary | ICD-10-CM | POA: Insufficient documentation

## 2013-08-15 DIAGNOSIS — R1030 Lower abdominal pain, unspecified: Secondary | ICD-10-CM

## 2013-08-15 MED ORDER — NAPROXEN 500 MG PO TABS: 500.0000 mg | ORAL_TABLET | Freq: Two times a day (BID) | ORAL | Status: AC

## 2013-08-15 NOTE — ED Notes (Signed)
Pt states rt side of groin hurting x 3 weeks, states pain is worse today, pt states the area feels tight. Pt has been taken naproxen for pain.

## 2013-08-15 NOTE — ED Notes (Signed)
PT c/o right groin pain x3 weeks unrelieved by naproxen OTC medication.

## 2013-08-15 NOTE — ED Notes (Signed)
MD at bedside. 

## 2013-08-15 NOTE — ED Provider Notes (Signed)
CSN: [id number]     Arrival date & time 08/15/13  1416 History  This chart was scribed for Maudry Diego, MD by Eston Mould, ED Scribe. This patient was seen in room APA08/APA08 and the patient's care was started at [facility].    Chief Complaint  Patient presents with  . Groin Pain    Patient is a 58 y.o. male presenting with groin pain. The history is provided by the patient. No language interpreter was used.  Groin Pain This is a new problem. The current episode started more than 1 week ago. The problem occurs constantly. The problem has not changed since onset.Pertinent negatives include no chest pain, no abdominal pain and no headaches. Nothing aggravates the symptoms. Nothing relieves the symptoms. He has tried nothing for the symptoms. The treatment provided no relief.   HPI Comments: Evan Rios is a 58 y.o. male who presents to the Emergency Department complaining of upper groin pain x 2-3 weeks. States he was on Pravastatin and was changed to Lipitor; states his groin pain began then. Has intermittent pain at this time. PCP: [facility] in [place], [place]. Is allergic to Oxycodone. Denies fever, chills, and cough.  Past Medical History  Diagnosis Date  . Hypertension   . Depression   . Hypercholesteremia   . Stroke    Past Surgical History  Procedure Laterality Date  . Knee surgery     Family History  Problem Relation Age of Onset  . Stroke Mother   . Cancer Mother   . Hypertension Father    History  Substance Use Topics  . Smoking status: Current Every Day Smoker -- 1.00 packs/day for 41 years    Types: Cigarettes  . Smokeless tobacco: Former Systems developer  . Alcohol Use: Yes     Comment: occas    Review of Systems  Constitutional: Negative for chills, appetite change and fatigue.  HENT: Negative for congestion, ear discharge and sinus pressure.   Eyes: Negative for discharge.  Respiratory: Negative for cough.   Cardiovascular: Negative for chest  pain.  Gastrointestinal: Negative for abdominal pain and diarrhea.  Genitourinary: Negative for frequency and hematuria.  Musculoskeletal: Negative for back pain.  Skin: Negative for rash.  Neurological: Negative for seizures and headaches.  Psychiatric/Behavioral: Negative for hallucinations.   Allergies  Bee venom and Oxycodone  Home Medications   Prior to Admission medications   Medication Sig Start Date End Date Taking? Authorizing Provider  aspirin EC 81 MG tablet Take 81 mg by mouth daily.   Yes Historical Provider, MD  atorvastatin (LIPITOR) 40 MG tablet Take 40 mg by mouth daily.  08/07/13  Yes Historical Provider, MD  amLODipine-benazepril (LOTREL) 10-20 MG per capsule Take 1 capsule by mouth daily.    Historical Provider, MD  citalopram (CELEXA) 20 MG tablet Take 20 mg by mouth daily.    Historical Provider, MD  traZODone (DESYREL) 100 MG tablet Take 100 mg by mouth at bedtime.    Historical Provider, MD   BP 155/88  Pulse 87  Temp(Src) 97.9 F (36.6 C) (Oral)  Resp 18  Ht 5\' 11"  (1.803 m)  Wt 217 lb (98.431 kg)  BMI 30.28 kg/m2  SpO2 97%  Physical Exam  Constitutional: He is oriented to person, place, and time. He appears well-developed.  HENT:  Head: Normocephalic.  Eyes: Conjunctivae and EOM are normal. No scleral icterus.  Neck: Neck supple. No thyromegaly present.  Cardiovascular: Normal rate and regular rhythm.  Exam reveals no gallop  and no friction rub.   No murmur heard. Pulmonary/Chest: No stridor. He has no wheezes. He has no rales. He exhibits no tenderness.  Abdominal: He exhibits no distension. There is no tenderness. There is no rebound.  Genitourinary:  Milder tenderness to R inguinal area.  Musculoskeletal: Normal range of motion. He exhibits no edema.  Lymphadenopathy:    He has no cervical adenopathy.  Neurological: He is oriented to person, place, and time. He exhibits normal muscle tone. Coordination normal.  Skin: No rash noted. No  erythema.  Psychiatric: He has a normal mood and affect. His behavior is normal.   ED Course  Procedures DIAGNOSTIC STUDIES: Oxygen Saturation is 97% on RA, normal by my interpretation.    COORDINATION OF CARE: 3:28 PM-Discussed treatment plan which includes refer pt to surgeon to r/o hernia and will discharge pt with medication. Pt agreed to plan.   Labs Review Labs Reviewed - No data to display  Imaging Review No results found.   EKG Interpretation None      MDM   Final diagnoses:  None    The chart was scribed for me under my direct supervision.  I personally performed the history, physical, and medical decision making and all procedures in the evaluation of this patient.Maudry Diego, MD 08/15/13 [phone]

## 2013-08-15 NOTE — Discharge Instructions (Signed)
Follow up with Dr. Arnoldo Morale in 1-2 weeks to recheck your groin

## 2014-01-14 ENCOUNTER — Other Ambulatory Visit (HOSPITAL_COMMUNITY): Payer: Self-pay | Admitting: Physician Assistant

## 2014-01-14 DIAGNOSIS — R945 Abnormal results of liver function studies: Secondary | ICD-10-CM

## 2014-01-17 ENCOUNTER — Ambulatory Visit (HOSPITAL_COMMUNITY)
Admission: RE | Admit: 2014-01-17 | Discharge: 2014-01-17 | Disposition: A | Discharge status: Home / Self Care | Payer: Medicare Other | Source: Ambulatory Visit | Attending: Physician Assistant | Admitting: Physician Assistant

## 2014-01-17 DIAGNOSIS — K769 Liver disease, unspecified: Secondary | ICD-10-CM | POA: Insufficient documentation

## 2014-01-17 DIAGNOSIS — R945 Abnormal results of liver function studies: Secondary | ICD-10-CM

## 2014-01-17 DIAGNOSIS — R7989 Other specified abnormal findings of blood chemistry: Secondary | ICD-10-CM | POA: Diagnosis not present

## 2014-02-11 ENCOUNTER — Other Ambulatory Visit (HOSPITAL_COMMUNITY): Payer: Self-pay | Admitting: Physician Assistant

## 2014-02-11 DIAGNOSIS — R932 Abnormal findings on diagnostic imaging of liver and biliary tract: Secondary | ICD-10-CM

## 2014-02-14 ENCOUNTER — Ambulatory Visit (HOSPITAL_COMMUNITY)
Admission: RE | Admit: 2014-02-14 | Discharge: 2014-02-14 | Disposition: A | Discharge status: Home / Self Care | Payer: Medicare Other | Source: Ambulatory Visit | Attending: Physician Assistant | Admitting: Physician Assistant

## 2014-02-14 ENCOUNTER — Encounter (HOSPITAL_COMMUNITY): Payer: Self-pay

## 2014-02-14 DIAGNOSIS — D1803 Hemangioma of intra-abdominal structures: Secondary | ICD-10-CM | POA: Insufficient documentation

## 2014-02-14 DIAGNOSIS — R932 Abnormal findings on diagnostic imaging of liver and biliary tract: Secondary | ICD-10-CM | POA: Insufficient documentation

## 2014-02-14 MED ORDER — GADOBENATE DIMEGLUMINE 529 MG/ML IV SOLN
19.0000 mL | Freq: Once | INTRAVENOUS | Status: AC | PRN
  Administered 2014-02-14: 19 mL via INTRAVENOUS

## 2014-02-26 ENCOUNTER — Encounter: Payer: Self-pay | Admitting: Internal Medicine

## 2014-03-28 ENCOUNTER — Ambulatory Visit (INDEPENDENT_AMBULATORY_CARE_PROVIDER_SITE_OTHER): Payer: Medicare Other | Admitting: Nurse Practitioner

## 2014-03-28 ENCOUNTER — Encounter: Payer: Self-pay | Admitting: Nurse Practitioner

## 2014-03-28 VITALS — BP 142/80 | HR 91 | Temp 97.5°F | Ht 71.0 in | Wt 221.0 lb

## 2014-03-28 DIAGNOSIS — R945 Abnormal results of liver function studies: Secondary | ICD-10-CM

## 2014-03-28 DIAGNOSIS — R7989 Other specified abnormal findings of blood chemistry: Secondary | ICD-10-CM | POA: Insufficient documentation

## 2014-03-28 DIAGNOSIS — D1803 Hemangioma of intra-abdominal structures: Secondary | ICD-10-CM | POA: Insufficient documentation

## 2014-03-28 NOTE — Progress Notes (Signed)
Primary Care Physician:  Mackey Birchwood Primary Gastroenterologist:  Dr. Gala Romney  Chief Complaint  Patient presents with  . abnormal MRI    HPI:   59 year old male presents on referral for multiple hepatic hemangiomas on MRI. Patient had an abdominal ultrasound on 01/17/14 which showed a 2.5 x 2.1 x 2.4 cm hyperechoic nodule to the right liver lobe most consistent with benign hemangioma. Follow-up MRI without contrast on 02/14/14 showed multiple hepatic hemangiomas with the largest measuring 2.4 x 2.6cm in the left lobe with an additional 2.0 cm lesion in the dome of the lateral segment of the left lobe, as well as at least 5 other sub-cm lesions. Patient was initially referred for imaging based on elevated LFTs at [facility] in [place]. Labs not in [facility] system, I have requested those.   The diagnostic imaging was done solely for the purpose of the elevated LFTs. He was not symptomatic. Denies abdominal pain, N/V/D, constipation. Denies hematochezia and melena. Has a bowel movement once a day and characteristic of a 4 on the [facility] Stool Scale. Denies dysphagia. No other upper of lower GI symptoms.    Past Medical History  Diagnosis Date  . Hypertension   . Depression   . Hypercholesteremia   . Stroke     Past Surgical History  Procedure Laterality Date  . Knee surgery      right    Current Outpatient Prescriptions  Medication Sig Dispense Refill  . amLODipine-benazepril (LOTREL) 10-20 MG per capsule Take 1 capsule by mouth daily.    Marland Kitchen aspirin EC 81 MG tablet Take 81 mg by mouth daily.    Marland Kitchen atorvastatin (LIPITOR) 40 MG tablet Take 40 mg by mouth daily.     . citalopram (CELEXA) 20 MG tablet Take 20 mg by mouth daily.    . traZODone (DESYREL) 100 MG tablet Take 100 mg by mouth at bedtime.    . naproxen (NAPROSYN) 500 MG tablet Take 1 tablet (500 mg total) by mouth 2 (two) times daily. (Patient not taking: Reported on 03/28/2014) 30 tablet 0   No current  facility-administered medications for this visit.    Allergies as of 03/28/2014 - Review Complete 03/28/2014  Allergen Reaction Noted  . Bee venom  11/23/2011  . Oxycodone  11/23/2011    Family History  Problem Relation Age of Onset  . Stroke Mother   . Cancer Mother   . Hypertension Father     History   Social History  . Marital Status: Divorced    Spouse Name: N/A    Number of Children: N/A  . Years of Education: N/A   Occupational History  . Not on file.   Social History Main Topics  . Smoking status: Current Every Day Smoker -- 1.00 packs/day for 41 years    Types: Cigarettes  . Smokeless tobacco: Former Systems developer  . Alcohol Use: Yes     Comment: occas  . Drug Use: No  . Sexual Activity: Not on file   Other Topics Concern  . Not on file   Social History Narrative    Review of Systems: Gen: Denies any fever, chills, fatigue, weight loss, lack of appetite.  CV: Denies chest pain, heart palpitations, peripheral edema, syncope.  Resp: Denies shortness of breath at rest or with exertion. Denies wheezing or cough.  GI: See HPI. Denies dysphagia or odynophagia. Denies jaundice, hematemesis, fecal incontinence. MS: Denies joint pain, muscle weakness, cramps, or limitation of movement.  Derm:  Denies rash, itching, dry skin Psych: Denies depression, anxiety, memory loss, and confusion Heme: Denies bruising, bleeding, and enlarged lymph nodes.  Physical Exam: BP 142/80 mmHg  Pulse 91  Temp(Src) 97.5 F (36.4 C) (Oral)  Ht 5\' 11"  (1.803 m)  Wt 221 lb (100.245 kg)  BMI 30.84 kg/m2 General:   Alert and oriented. Pleasant and cooperative. Well-nourished and well-developed. Somewhat slow on responses to questions consistent with history of hemorrhagic CVA. Head:  Normocephalic and atraumatic. Eyes:  Without icterus, sclera clear and conjunctiva pink.  Ears:  Normal auditory acuity. Mouth:  No deformity or lesions, oral mucosa pink. No OP edema. Neck:  Supple, without  mass or thyromegaly. Lungs:  Clear to auscultation bilaterally. No wheezes, rales, or rhonchi. No distress.  Heart:  S1, S2 present without murmurs appreciated.  Abdomen:  +BS, rounded, soft, non-tender and non-distended. No HSM noted. No guarding or rebound. No masses appreciated.  Rectal:  Deferred  Msk:  Symmetrical without gross deformities. Normal posture. Pulses:  Normal DP pulses noted. Extremities:  Without clubbing or edema. Neurologic:  Alert and  oriented x4;  grossly normal neurologically. Skin:  Intact without significant lesions or rashes. Cervical Nodes:  No significant cervical adenopathy. Psych:  Alert and cooperative. Normal mood and affect.     03/28/2014 1:43 PM

## 2014-03-28 NOTE — Patient Instructions (Addendum)
1. Follow-up as needed for any abdominal symptoms. 2. We will call you and let you know if you need to repeat the MRI in one year. 3. We will check your bloodwork for Hep C based on your age and increased liver enzymes and call you with the results. 4. HIGHLY RECOMMEND setting up for a screening colonoscopy soon

## 2014-03-29 ENCOUNTER — Telehealth: Payer: Self-pay | Admitting: Nurse Practitioner

## 2014-03-29 NOTE — Assessment & Plan Note (Signed)
Hepatic hemangiomas x 5+, all smaller than 5cm. Likely no need to repeat imaging at this point unless patient begins to have symptoms. This finding is unlikely to be causing his elevated LFTs. Will work-up other possible etiologies.

## 2014-03-29 NOTE — Assessment & Plan Note (Signed)
Elevated LFTs per PCP (no copy of labs on record, though they have been requested.) Benign hepatic hemangiomas unlikely culprit for this. Patient is hypertensive with hyperlipidemia, no known screening for Hep C. At this point unable to rule out fatty liver versus hep C. Will order Hep C antibody to evaluate and proceed based on these results.

## 2014-03-29 NOTE — Telephone Encounter (Signed)
Please call the patient and inform him at this point we do not need to repeat imaging unless he develops symptoms.  We will call him about his labs. However, we would like to see him again in 3 months to re-evaluate everything including the lab results and how to proceed from there.

## 2014-04-02 ENCOUNTER — Encounter: Payer: Self-pay | Admitting: Internal Medicine

## 2014-04-02 NOTE — Telephone Encounter (Signed)
Tried to call pt- NA and no voicemail. 

## 2014-04-02 NOTE — Telephone Encounter (Signed)
Pt is aware. Please schedule pt.

## 2014-04-02 NOTE — Telephone Encounter (Signed)
APPOINTMENT MADE AND LETTER SENT °

## 2014-04-03 NOTE — Progress Notes (Signed)
cc'ed to pcp °

## 2014-04-12 LAB — HEPATITIS C ANTIBODY: HCV AB: NEGATIVE

## 2014-07-03 ENCOUNTER — Encounter: Payer: Self-pay | Admitting: Nurse Practitioner

## 2014-07-03 ENCOUNTER — Ambulatory Visit (INDEPENDENT_AMBULATORY_CARE_PROVIDER_SITE_OTHER): Payer: Medicare Other | Admitting: Nurse Practitioner

## 2014-07-03 ENCOUNTER — Other Ambulatory Visit: Payer: Self-pay

## 2014-07-03 VITALS — BP 131/79 | HR 87 | Temp 97.6°F | Ht 71.0 in | Wt 215.4 lb

## 2014-07-03 DIAGNOSIS — R7989 Other specified abnormal findings of blood chemistry: Secondary | ICD-10-CM | POA: Diagnosis not present

## 2014-07-03 DIAGNOSIS — Z1211 Encounter for screening for malignant neoplasm of colon: Secondary | ICD-10-CM | POA: Diagnosis not present

## 2014-07-03 DIAGNOSIS — R945 Abnormal results of liver function studies: Principal | ICD-10-CM

## 2014-07-03 DIAGNOSIS — D1803 Hemangioma of intra-abdominal structures: Secondary | ICD-10-CM | POA: Diagnosis not present

## 2014-07-03 LAB — CBC WITH DIFFERENTIAL/PLATELET
BASOS ABS: 0 10*3/uL (ref 0.0–0.1)
Basophils Relative: 0 % (ref 0–1)
EOS PCT: 1 % (ref 0–5)
Eosinophils Absolute: 0.1 10*3/uL (ref 0.0–0.7)
HEMATOCRIT: 44.1 % (ref 39.0–52.0)
Hemoglobin: 15 g/dL (ref 13.0–17.0)
LYMPHS PCT: 42 % (ref 12–46)
Lymphs Abs: 3.2 10*3/uL (ref 0.7–4.0)
MCH: 30.4 pg (ref 26.0–34.0)
MCHC: 34 g/dL (ref 30.0–36.0)
MCV: 89.5 fL (ref 78.0–100.0)
MPV: 10.3 fL (ref 8.6–12.4)
Monocytes Absolute: 0.6 10*3/uL (ref 0.1–1.0)
Monocytes Relative: 8 % (ref 3–12)
NEUTROS ABS: 3.8 10*3/uL (ref 1.7–7.7)
Neutrophils Relative %: 49 % (ref 43–77)
Platelets: 369 10*3/uL (ref 150–400)
RBC: 4.93 MIL/uL (ref 4.22–5.81)
RDW: 13.1 % (ref 11.5–15.5)
WBC: 7.7 10*3/uL (ref 4.0–10.5)

## 2014-07-03 MED ORDER — PEG 3350-KCL-NA BICARB-NACL 420 G PO SOLR
4000.0000 mL | Freq: Once | ORAL | Status: DC
Start: 2014-07-03 — End: 2014-10-02

## 2014-07-03 NOTE — Progress Notes (Signed)
Referring Provider: Avelino Leeds* Primary Care Physician:  Mackey Birchwood Primary GI:  Dr. Gala Romney  Chief Complaint  Patient presents with  . Elevated Hepatic Enzymes    HPI:   59 year old male presents for follow-up on multiple hepatic hemangiomas on MRI and elevated LFTs. Records from PCP were requested including labs demonstrating elevated LFTs but have not been received. At last visit her was tested for hep C based on his age and transaminitis which was subsequently negative. He has never had a colonoscopy and is due based on his age.  Today he states he has been doing well. He is not having any symptoms. Denies abdominal pain, N/V, scleral icterus, dark urine. Has regular bowel movements consistent with [facility] Scale 4, no straining. Denies any hematochezia, melena. Denies fever, chills, unintentional weight loss, changes in appetite. Denies chest pain, palpitations, dyspnea. Denies any other upper or lower GI symptoms.  Past Medical History  Diagnosis Date  . Hypertension   . Depression   . Hypercholesteremia   . Stroke 2003    hemorrhagic stroke    Past Surgical History  Procedure Laterality Date  . Knee surgery      right    Current Outpatient Prescriptions  Medication Sig Dispense Refill  . amLODipine-benazepril (LOTREL) 10-20 MG per capsule Take 1 capsule by mouth daily.    Marland Kitchen aspirin EC 81 MG tablet Take 81 mg by mouth daily.    Marland Kitchen atorvastatin (LIPITOR) 40 MG tablet Take 40 mg by mouth daily.     . citalopram (CELEXA) 20 MG tablet Take 20 mg by mouth daily.    . traZODone (DESYREL) 100 MG tablet Take 100 mg by mouth at bedtime.    . naproxen (NAPROSYN) 500 MG tablet Take 1 tablet (500 mg total) by mouth 2 (two) times daily. (Patient not taking: Reported on 07/03/2014) 30 tablet 0   No current facility-administered medications for this visit.    Allergies as of 07/03/2014 - Review Complete 03/29/2014  Allergen Reaction Noted  . Bee venom   11/23/2011  . Oxycodone  11/23/2011    Family History  Problem Relation Age of Onset  . Stroke Mother   . Cancer Mother   . Hypertension Father   . Colon cancer Father     "My dad may have, but my memory isn't great."    History   Social History  . Marital Status: Divorced    Spouse Name: N/A  . Number of Children: N/A  . Years of Education: N/A   Social History Main Topics  . Smoking status: Current Every Day Smoker -- 1.00 packs/day for 41 years    Types: Cigarettes  . Smokeless tobacco: Former Systems developer  . Alcohol Use: 0.0 oz/week    0 Standard drinks or equivalent per week     Comment: occassionally/socially on Sundays for football games 1-2 beer  . Drug Use: No  . Sexual Activity: Not on file   Other Topics Concern  . None   Social History Narrative    Review of Systems: General: Negative for anorexia, weight loss, fever, chills, fatigue, weakness. Eyes: Negative for vision changes.  ENT: Negative for hoarseness, difficulty swallowing. CV: Negative for chest pain, angina, palpitations, peripheral edema.  Respiratory: Negative for dyspnea at rest, cough, wheezing.  GI: See history of present illness. Derm: Negative for rash or itching.  Neuro: Negative for weakness, seizure, frequent headaches, confusion.  Psych: Negative for anxiety, depression, suicidal ideation, hallucinations.  Endo:  Negative for unusual weight change.  Heme: Negative for bruising or bleeding. Allergy: Negative for rash or hives.   Physical Exam: BP 131/79 mmHg  Pulse 87  Temp(Src) 97.6 F (36.4 C) (Oral)  Ht 5\' 11"  (1.803 m)  Wt 215 lb 6.4 oz (97.705 kg)  BMI 30.06 kg/m2 General:   Alert and oriented. No distress noted. Pleasant and cooperative.  Head:  Normocephalic and atraumatic. Eyes:  Conjuctiva clear without scleral icterus. Mouth:  Oral mucosa pink and moist. No OP edema. Neck:  Supple, without mass or thyromegaly. Lungs:  Clear to auscultation bilaterally. No wheezes,  rales, or rhonchi. No distress.  Heart:  S1, S2 present without murmurs, rubs, or gallops. Regular rate and rhythm. Abdomen:  +BS, rounded, soft, non-tender and non-distended. No rebound or guarding. No HSM or masses noted. Msk:  Symmetrical without gross deformities. Normal posture. Extremities:  Without edema. Neurologic:  Alert and  oriented x4;  grossly normal neurologically. Skin:  Intact without significant lesions or rashes. Psych:  Alert and cooperative. Normal mood and affect.    07/03/2014 8:20 AM

## 2014-07-03 NOTE — Patient Instructions (Signed)
1. We will check your labs today and call you with the results 2. We will schedule your procedure (colonoscopy) for you 3. Further recommendations to be based on the results of your procedure.

## 2014-07-04 LAB — HEPATIC FUNCTION PANEL
ALBUMIN: 4.3 g/dL (ref 3.5–5.2)
ALK PHOS: 105 U/L (ref 39–117)
ALT: 45 U/L (ref 0–53)
AST: 22 U/L (ref 0–37)
BILIRUBIN TOTAL: 0.9 mg/dL (ref 0.2–1.2)
Bilirubin, Direct: 0.2 mg/dL (ref 0.0–0.3)
Indirect Bilirubin: 0.7 mg/dL (ref 0.2–1.2)
TOTAL PROTEIN: 8 g/dL (ref 6.0–8.3)

## 2014-07-04 NOTE — Assessment & Plan Note (Signed)
59 year old African-American male was never had a colonoscopy. He is overdue based on current guidelines and his age. He has put this off because of anxiety related to the procedure. He has had multiple medical problems across the course of his life and this makes him anxious about another procedure. However after extensively discussing the risks and benefits of colonoscopy he is agreeable to having one. He denies abdominal pain, hematochezia, melena, unintentional weight loss, fever, chills, chest pain, dyspnea, or any other red flag/warning signs or symptoms.  Proceed with TCS with 12.5 pre-procedure Phenergan (anxiety and antidepressant) with Dr. Gala Romney in near future: the risks, benefits, and alternatives have been discussed with the patient in detail. The patient states understanding and desires to proceed.  Patient is not currently on any anticoagulants or diabetic medications. He is on 81 mg daily aspirin as well as Celexa. We will write him with 12.5 mg preprocedure Phenergan due to anxiety and antidepressant use.

## 2014-07-04 NOTE — Assessment & Plan Note (Signed)
59 year old male seen for follow-up for elevated LFTs per his PCP although no documentation of these labs and received yet. We have requested these records again. At last visit he was tested for hepatitis C which came back negative. His MRI showed benign hepatic hemangiomas and this is unlikely a culprit for transaminitis. He is currently asymptomatic. They'll order CBC and hepatic function panel to recheck his liver enzymes which we can then compared to the previous records received by primary care. Return for follow-up in 3 months.

## 2014-07-08 NOTE — Progress Notes (Signed)
cc'ed to pcp °

## 2014-07-22 ENCOUNTER — Encounter (HOSPITAL_COMMUNITY): Payer: Self-pay | Admitting: *Deleted

## 2014-07-22 ENCOUNTER — Encounter (HOSPITAL_COMMUNITY)
Admission: RE | Disposition: A | Discharge status: Home / Self Care | Payer: Self-pay | Source: Ambulatory Visit | Attending: Internal Medicine

## 2014-07-22 ENCOUNTER — Ambulatory Visit (HOSPITAL_COMMUNITY)
Admission: RE | Admit: 2014-07-22 | Discharge: 2014-07-22 | Disposition: A | Discharge status: Home / Self Care | Payer: Medicare Other | Source: Ambulatory Visit | Attending: Internal Medicine | Admitting: Internal Medicine

## 2014-07-22 ENCOUNTER — Telehealth: Payer: Self-pay

## 2014-07-22 DIAGNOSIS — Z8673 Personal history of transient ischemic attack (TIA), and cerebral infarction without residual deficits: Secondary | ICD-10-CM | POA: Diagnosis not present

## 2014-07-22 DIAGNOSIS — Z800 Family history of malignant neoplasm of digestive organs: Secondary | ICD-10-CM | POA: Diagnosis not present

## 2014-07-22 DIAGNOSIS — F17210 Nicotine dependence, cigarettes, uncomplicated: Secondary | ICD-10-CM | POA: Diagnosis not present

## 2014-07-22 DIAGNOSIS — Z91030 Bee allergy status: Secondary | ICD-10-CM | POA: Diagnosis not present

## 2014-07-22 DIAGNOSIS — K5730 Diverticulosis of large intestine without perforation or abscess without bleeding: Secondary | ICD-10-CM | POA: Diagnosis not present

## 2014-07-22 DIAGNOSIS — K648 Other hemorrhoids: Secondary | ICD-10-CM | POA: Insufficient documentation

## 2014-07-22 DIAGNOSIS — E780 Pure hypercholesterolemia: Secondary | ICD-10-CM | POA: Diagnosis not present

## 2014-07-22 DIAGNOSIS — F329 Major depressive disorder, single episode, unspecified: Secondary | ICD-10-CM | POA: Insufficient documentation

## 2014-07-22 DIAGNOSIS — Z886 Allergy status to analgesic agent status: Secondary | ICD-10-CM | POA: Diagnosis not present

## 2014-07-22 DIAGNOSIS — R945 Abnormal results of liver function studies: Secondary | ICD-10-CM

## 2014-07-22 DIAGNOSIS — I10 Essential (primary) hypertension: Secondary | ICD-10-CM | POA: Diagnosis not present

## 2014-07-22 DIAGNOSIS — Z1211 Encounter for screening for malignant neoplasm of colon: Secondary | ICD-10-CM | POA: Diagnosis present

## 2014-07-22 DIAGNOSIS — D1803 Hemangioma of intra-abdominal structures: Secondary | ICD-10-CM

## 2014-07-22 DIAGNOSIS — R7989 Other specified abnormal findings of blood chemistry: Secondary | ICD-10-CM

## 2014-07-22 HISTORY — PX: COLONOSCOPY: SHX5424

## 2014-07-22 SURGERY — COLONOSCOPY
Anesthesia: Moderate Sedation

## 2014-07-22 MED ORDER — MIDAZOLAM HCL 5 MG/5ML IJ SOLN
INTRAMUSCULAR | Status: DC | PRN
  Administered 2014-07-22 (×2): 2 mg via INTRAVENOUS

## 2014-07-22 MED ORDER — MEPERIDINE HCL 100 MG/ML IJ SOLN
INTRAMUSCULAR | Status: AC
  Filled 2014-07-22: qty 2

## 2014-07-22 MED ORDER — MIDAZOLAM HCL 5 MG/5ML IJ SOLN
INTRAMUSCULAR | Status: AC
  Filled 2014-07-22: qty 10

## 2014-07-22 MED ORDER — ONDANSETRON HCL 4 MG/2ML IJ SOLN
INTRAMUSCULAR | Status: DC | PRN
  Administered 2014-07-22: 4 mg via INTRAVENOUS

## 2014-07-22 MED ORDER — SODIUM CHLORIDE 0.9 % IV SOLN
INTRAVENOUS | Status: DC
  Administered 2014-07-22: 13:00:00 via INTRAVENOUS

## 2014-07-22 MED ORDER — PROMETHAZINE HCL 25 MG/ML IJ SOLN
INTRAMUSCULAR | Status: AC
  Filled 2014-07-22: qty 1

## 2014-07-22 MED ORDER — SODIUM CHLORIDE 0.9 % IJ SOLN
INTRAMUSCULAR | Status: AC
  Filled 2014-07-22: qty 3

## 2014-07-22 MED ORDER — STERILE WATER FOR IRRIGATION IR SOLN
Status: DC | PRN
  Administered 2014-07-22: 15:00:00

## 2014-07-22 MED ORDER — ONDANSETRON HCL 4 MG/2ML IJ SOLN
INTRAMUSCULAR | Status: AC
  Filled 2014-07-22: qty 2

## 2014-07-22 MED ORDER — PROMETHAZINE HCL 25 MG/ML IJ SOLN
12.5000 mg | Freq: Once | INTRAMUSCULAR | Status: AC
  Administered 2014-07-22: 12.5 mg via INTRAVENOUS

## 2014-07-22 MED ORDER — MEPERIDINE HCL 100 MG/ML IJ SOLN
INTRAMUSCULAR | Status: DC | PRN
  Administered 2014-07-22 (×2): 50 mg via INTRAVENOUS

## 2014-07-22 NOTE — Telephone Encounter (Signed)
Called patient back and he stated that he finished drinking the jug but he is still passing the clear yellow fluid. He still has to do the enema. He will come on as schedule.

## 2014-07-22 NOTE — Op Note (Signed)
[facility] [address] [place], [address]   COLONOSCOPY PROCEDURE REPORT  PATIENT: Evan Rios, Evan Rios  MR#: [id number] BIRTHDATE: [birth year] , 11  yrs. old GENDER: male ENDOSCOPIST: R.  Garfield Cornea, MD FACP [facility] REFERRED XB:WIOMBTD [place], [place] PROCEDURE DATE:  Jul 26, 2014 PROCEDURE:   Screening colonoscopy INDICATIONS:First ever average risk colorectal cancer screening examination. MEDICATIONS: Versed 4 mg IV and Demerol 100 mg IV in divided doses. Phenergan 12.5 mg IV.  Zofran 4 mg IV. ASA CLASS:       Class II  CONSENT: The risks, benefits, alternatives and imponderables including but not limited to bleeding, perforation as well as the possibility of a missed lesion have been reviewed.  The potential for biopsy, lesion removal, etc. have also been discussed. Questions have been answered.  All parties agreeable.  Please see the history and physical in the medical record for more information.  DESCRIPTION OF PROCEDURE:   After the risks benefits and alternatives of the procedure were thoroughly explained, informed consent was obtained.  The digital rectal exam revealed no abnormalities of the rectum.   The EC-[id number] ([id number])  endoscope was introduced through the anus and advanced to the terminal ileum which was intubated for a short distance. No adverse events experienced.   The quality of the prep was adequate  The instrument was then slowly withdrawn as the colon was fully examined.      COLON FINDINGS: Anal papilla and internal hemorrhoids; otherwise, normal rectum.  One large ascending colon diverticulum; the remainder the colon mucosa appeared normal.  The distal 10 cm of terminal ileal mucosa also appeared normal.  Retroflexion was performed. .  Withdrawal time=9 minutes 0 seconds.  The scope was withdrawn and the procedure completed. COMPLICATIONS: There were no immediate complications.  ENDOSCOPIC IMPRESSION: Single ascending colon  diverticulum; otherwise normal colonoscopy  RECOMMENDATIONS: Repeat colonoscopy in 10 years for screening purposes  eSigned:  R. Garfield Cornea, MD Rosalita Chessman [facility] Jul 26, 2014 3:35 PM   cc:  CPT CODES: ICD CODES:  The ICD and CPT codes recommended by this software are interpretations from the data that the clinical staff has captured with the software.  The verification of the translation of this report to the ICD and CPT codes and modifiers is the sole responsibility of the health care institution and practicing physician where this report was generated.  [employer] will not be held responsible for the validity of the ICD and CPT codes included on this report.  AMA assumes no liability for data contained or not contained herein. CPT is a Designer, television/film set of the [employer].  PATIENT NAME:  Brandon, Wiechman MR#: [id number]

## 2014-07-22 NOTE — Telephone Encounter (Signed)
Pt called this morning because his stools are yellow and he is wanting to know what he should do. He only drank half of the jug because it was making him sick. I advised him to try and drank some more before 9:00 am and then do the enema at 10:30-11:00. He stated that he would try.

## 2014-07-22 NOTE — Discharge Instructions (Signed)
°  Colonoscopy Discharge Instructions  Read the instructions outlined below and refer to this sheet in the next few weeks. These discharge instructions provide you with general information on caring for yourself after you leave the hospital. Your doctor may also give you specific instructions. While your treatment has been planned according to the most current medical practices available, unavoidable complications occasionally occur. If you have any problems or questions after discharge, call Dr. Gala Romney at [phone]. ACTIVITY  You may resume your regular activity, but move at a slower pace for the next 24 hours.   Take frequent rest periods for the next 24 hours.   Walking will help get rid of the air and reduce the bloated feeling in your belly (abdomen).   No driving for 24 hours (because of the medicine (anesthesia) used during the test).    Do not sign any important legal documents or operate any machinery for 24 hours (because of the anesthesia used during the test).  NUTRITION  Drink plenty of fluids.   You may resume your normal diet as instructed by your doctor.   Begin with a light meal and progress to your normal diet. Heavy or fried foods are harder to digest and may make you feel sick to your stomach (nauseated).   Avoid alcoholic beverages for 24 hours or as instructed.  MEDICATIONS  You may resume your normal medications unless your doctor tells you otherwise.  WHAT YOU CAN EXPECT TODAY  Some feelings of bloating in the abdomen.   Passage of more gas than usual.   Spotting of blood in your stool or on the toilet paper.  IF YOU HAD POLYPS REMOVED DURING THE COLONOSCOPY:  No aspirin products for 7 days or as instructed.   No alcohol for 7 days or as instructed.   Eat a soft diet for the next 24 hours.  FINDING OUT THE RESULTS OF YOUR TEST Not all test results are available during your visit. If your test results are not back during the visit, make an appointment  with your caregiver to find out the results. Do not assume everything is normal if you have not heard from your caregiver or the medical facility. It is important for you to follow up on all of your test results.  SEEK IMMEDIATE MEDICAL ATTENTION IF:  You have more than a spotting of blood in your stool.   Your belly is swollen (abdominal distention).   You are nauseated or vomiting.   You have a temperature over 101.   You have abdominal pain or discomfort that is severe or gets worse throughout the day.   I recommend a repeat colonoscopy in 10 years for screening purposes

## 2014-07-22 NOTE — Interval H&P Note (Signed)
History and Physical Interval Note:  07/22/2014 3:03 PM  Evan Rios  has presented today for surgery, with the diagnosis of elevated LFTs, hepatic hemangioma, screening colonoscopy  The various methods of treatment have been discussed with the patient and family. After consideration of risks, benefits and other options for treatment, the patient has consented to  Procedure(s) with comments: COLONOSCOPY (N/A) - 1300 as a surgical intervention .  The patient's history has been reviewed, patient examined, no change in status, stable for surgery.  I have reviewed the patient's chart and labs.  Questions were answered to the patient's satisfaction.     Jacki Couse   No change. Screening colonoscopy per plan.The risks, benefits, limitations, alternatives and imponderables have been reviewed with the patient. Questions have been answered. All parties are agreeable.

## 2014-07-22 NOTE — H&P (View-Only) (Signed)
Referring Provider: Avelino Leeds* Primary Care Physician:  Mackey Birchwood Primary GI:  Dr. Gala Romney  Chief Complaint  Patient presents with  . Elevated Hepatic Enzymes    HPI:   59 year old male presents for follow-up on multiple hepatic hemangiomas on MRI and elevated LFTs. Records from PCP were requested including labs demonstrating elevated LFTs but have not been received. At last visit her was tested for hep C based on his age and transaminitis which was subsequently negative. He has never had a colonoscopy and is due based on his age.  Today he states he has been doing well. He is not having any symptoms. Denies abdominal pain, N/V, scleral icterus, dark urine. Has regular bowel movements consistent with [facility] Scale 4, no straining. Denies any hematochezia, melena. Denies fever, chills, unintentional weight loss, changes in appetite. Denies chest pain, palpitations, dyspnea. Denies any other upper or lower GI symptoms.  Past Medical History  Diagnosis Date  . Hypertension   . Depression   . Hypercholesteremia   . Stroke 2003    hemorrhagic stroke    Past Surgical History  Procedure Laterality Date  . Knee surgery      right    Current Outpatient Prescriptions  Medication Sig Dispense Refill  . amLODipine-benazepril (LOTREL) 10-20 MG per capsule Take 1 capsule by mouth daily.    Marland Kitchen aspirin EC 81 MG tablet Take 81 mg by mouth daily.    Marland Kitchen atorvastatin (LIPITOR) 40 MG tablet Take 40 mg by mouth daily.     . citalopram (CELEXA) 20 MG tablet Take 20 mg by mouth daily.    . traZODone (DESYREL) 100 MG tablet Take 100 mg by mouth at bedtime.    . naproxen (NAPROSYN) 500 MG tablet Take 1 tablet (500 mg total) by mouth 2 (two) times daily. (Patient not taking: Reported on 07/03/2014) 30 tablet 0   No current facility-administered medications for this visit.    Allergies as of 07/03/2014 - Review Complete 03/29/2014  Allergen Reaction Noted  . Bee venom   11/23/2011  . Oxycodone  11/23/2011    Family History  Problem Relation Age of Onset  . Stroke Mother   . Cancer Mother   . Hypertension Father   . Colon cancer Father     "My dad may have, but my memory isn't great."    History   Social History  . Marital Status: Divorced    Spouse Name: N/A  . Number of Children: N/A  . Years of Education: N/A   Social History Main Topics  . Smoking status: Current Every Day Smoker -- 1.00 packs/day for 41 years    Types: Cigarettes  . Smokeless tobacco: Former Systems developer  . Alcohol Use: 0.0 oz/week    0 Standard drinks or equivalent per week     Comment: occassionally/socially on Sundays for football games 1-2 beer  . Drug Use: No  . Sexual Activity: Not on file   Other Topics Concern  . None   Social History Narrative    Review of Systems: General: Negative for anorexia, weight loss, fever, chills, fatigue, weakness. Eyes: Negative for vision changes.  ENT: Negative for hoarseness, difficulty swallowing. CV: Negative for chest pain, angina, palpitations, peripheral edema.  Respiratory: Negative for dyspnea at rest, cough, wheezing.  GI: See history of present illness. Derm: Negative for rash or itching.  Neuro: Negative for weakness, seizure, frequent headaches, confusion.  Psych: Negative for anxiety, depression, suicidal ideation, hallucinations.  Endo:  Negative for unusual weight change.  Heme: Negative for bruising or bleeding. Allergy: Negative for rash or hives.   Physical Exam: BP 131/79 mmHg  Pulse 87  Temp(Src) 97.6 F (36.4 C) (Oral)  Ht 5\' 11"  (1.803 m)  Wt 215 lb 6.4 oz (97.705 kg)  BMI 30.06 kg/m2 General:   Alert and oriented. No distress noted. Pleasant and cooperative.  Head:  Normocephalic and atraumatic. Eyes:  Conjuctiva clear without scleral icterus. Mouth:  Oral mucosa pink and moist. No OP edema. Neck:  Supple, without mass or thyromegaly. Lungs:  Clear to auscultation bilaterally. No wheezes,  rales, or rhonchi. No distress.  Heart:  S1, S2 present without murmurs, rubs, or gallops. Regular rate and rhythm. Abdomen:  +BS, rounded, soft, non-tender and non-distended. No rebound or guarding. No HSM or masses noted. Msk:  Symmetrical without gross deformities. Normal posture. Extremities:  Without edema. Neurologic:  Alert and  oriented x4;  grossly normal neurologically. Skin:  Intact without significant lesions or rashes. Psych:  Alert and cooperative. Normal mood and affect.    07/03/2014 8:20 AM

## 2014-07-22 NOTE — Telephone Encounter (Signed)
Thanks

## 2014-07-22 NOTE — Telephone Encounter (Signed)
Call him back once again and encourage him a little later this morning - emphasize the importance of an adequate preparation. Thanks.

## 2014-07-23 ENCOUNTER — Encounter (HOSPITAL_COMMUNITY): Payer: Self-pay | Admitting: Internal Medicine

## 2014-10-02 ENCOUNTER — Encounter: Payer: Self-pay | Admitting: Nurse Practitioner

## 2014-10-02 ENCOUNTER — Ambulatory Visit (INDEPENDENT_AMBULATORY_CARE_PROVIDER_SITE_OTHER): Payer: Medicare Other | Admitting: Nurse Practitioner

## 2014-10-02 VITALS — BP 131/80 | HR 82 | Temp 97.6°F | Ht 71.0 in | Wt 218.0 lb

## 2014-10-02 DIAGNOSIS — R7989 Other specified abnormal findings of blood chemistry: Secondary | ICD-10-CM | POA: Diagnosis not present

## 2014-10-02 DIAGNOSIS — R945 Abnormal results of liver function studies: Principal | ICD-10-CM

## 2014-10-02 LAB — HEPATIC FUNCTION PANEL
ALT: 24 U/L (ref 0–53)
AST: 18 U/L (ref 0–37)
Albumin: 4.1 g/dL (ref 3.5–5.2)
Alkaline Phosphatase: 104 U/L (ref 39–117)
BILIRUBIN DIRECT: 0.1 mg/dL (ref 0.0–0.3)
BILIRUBIN TOTAL: 0.5 mg/dL (ref 0.2–1.2)
Indirect Bilirubin: 0.4 mg/dL (ref 0.2–1.2)
TOTAL PROTEIN: 7.5 g/dL (ref 6.0–8.3)

## 2014-10-02 NOTE — Progress Notes (Signed)
Referring Provider: Avelino Leeds* Primary Care Physician:  Mackey Birchwood Primary GI:  Dr. Gala Romney  Chief Complaint  Patient presents with  . Follow-up    doing ok    HPI:   59 year old male presents for follow-up on elevated LFTs. Hep C antibody negative. CBC and hepatic function panel all normal which were last drawn in April of this year. No changes in kidney or liver function. Screening colonoscopy completed 07/22/2014 found single ascending colon diverticulum otherwise normal colonoscopy. Recommended repeat in 10 years for screening purposes. Labs are see this previously requested only included a CBC and basic metabolic panel. No elevations in LFTs found in the reports sent.  Today he states he is "feeling great." Denies abdominal pain, N/V, hematochezia, melena, changes in bowel havits, yellowing of the skin or eyes, confusion, and darkened urine. He is asymptomatic from a GI standpoint. Denies chest pain, dyspnea, dizziness, lightheadedness, syncope, near syncope. Denies any other upper or lower GI symptoms.  Past Medical History  Diagnosis Date  . Hypertension   . Depression   . Hypercholesteremia   . Stroke 2003    hemorrhagic stroke    Past Surgical History  Procedure Laterality Date  . Knee surgery      right  . Colonoscopy N/A 07/22/2014    RMR: anal papilla and internal hemorrhoids    Current Outpatient Prescriptions  Medication Sig Dispense Refill  . amLODipine-benazepril (LOTREL) 10-20 MG per capsule Take 1 capsule by mouth daily.    Marland Kitchen aspirin EC 81 MG tablet Take 81 mg by mouth daily.    Marland Kitchen atorvastatin (LIPITOR) 40 MG tablet Take 40 mg by mouth daily.     . citalopram (CELEXA) 20 MG tablet Take 20 mg by mouth daily.    . naproxen (NAPROSYN) 500 MG tablet Take 1 tablet (500 mg total) by mouth 2 (two) times daily. 30 tablet 0  . traZODone (DESYREL) 100 MG tablet Take 100 mg by mouth at bedtime.     No current facility-administered  medications for this visit.    Allergies as of 10/02/2014 - Review Complete 10/02/2014  Allergen Reaction Noted  . Bee venom Anaphylaxis 11/23/2011  . Oxycodone  11/23/2011    Family History  Problem Relation Age of Onset  . Stroke Mother   . Cancer Mother   . Hypertension Father   . Colon cancer Father     "My dad may have, but my memory isn't great."    History   Social History  . Marital Status: Divorced    Spouse Name: N/A  . Number of Children: N/A  . Years of Education: N/A   Social History Main Topics  . Smoking status: Current Every Day Smoker -- 1.00 packs/day for 41 years    Types: Cigarettes  . Smokeless tobacco: Former Systems developer  . Alcohol Use: 0.0 oz/week    0 Standard drinks or equivalent per week     Comment: occassionally/socially on Sundays for football games 1-2 beer  . Drug Use: No  . Sexual Activity: Not on file   Other Topics Concern  . None   Social History Narrative    Review of Systems: General: Negative for anorexia, weight loss, fever, chills, fatigue, weakness. Eyes: Negative for vision changes or yellowing of the eyes. ENT: Negative for hoarseness, difficulty swallowing. CV: Negative for chest pain, angina, palpitations, dyspnea on exertion, peripheral edema.  Respiratory: Negative for dyspnea at rest, dyspnea on exertion, cough, sputum, wheezing.  GI:  See history of present illness. Derm: Negative for rash or itching.  Neuro: Negative for weakness, memory loss, confusion.  Endo: Negative for unusual weight change.  Heme: Negative for bruising or bleeding. Allergy: Negative for rash or hives.   Physical Exam: BP 131/80 mmHg  Pulse 82  Temp(Src) 97.6 F (36.4 C) (Oral)  Ht 5\' 11"  (1.803 m)  Wt 218 lb (98.884 kg)  BMI 30.42 kg/m2 General:   Alert and oriented. Pleasant and cooperative. Well-nourished and well-developed.  Head:  Normocephalic and atraumatic. Eyes:  Without icterus, sclera clear and conjunctiva pink.  Ears:  Normal  auditory acuity. Cardiovascular:  S1, S2 present without murmurs appreciated. Normal pulses noted. Extremities without clubbing or edema. Respiratory:  Clear to auscultation bilaterally. No wheezes, rales, or rhonchi. No distress.  Gastrointestinal:  +BS, rounded, soft, non-tender and non-distended. No HSM noted. No guarding or rebound. No masses appreciated.  Rectal:  Deferred  Skin:  Intact without significant lesions or rashes. Neurologic:  Alert and oriented x4;  grossly normal neurologically. Psych:  Alert and cooperative. Normal mood and affect. Heme/Lymph/Immune: No excessive bruising noted.    10/02/2014 8:20 AM

## 2014-10-02 NOTE — Assessment & Plan Note (Addendum)
59 year old male initially referred to [place] for elevated LFTs. Labs were requested from the referring provider, however last provided did not include any hepatic function. Patient has been asymptomatic from a GI standpoint both with the initial visit and with follow-up. CBC, hepatitis C antibody, and hepatic function panel all ordered in April were completely normal. The only abnormality found were multiple hepatic hemangiomas on MRI. The patient is on a statin medication but again his LFTs are completely normal from every lap report I could find. At this point we will recheck his hepatic function panel for any changes in his labs and plan for follow-up in 6 months to reassess. We'll also provided education on diet and exercise in order to prevent any fatty infiltration of the liver. Imaging studies to date have not demonstrated this as of yet.

## 2014-10-02 NOTE — Patient Instructions (Addendum)
1. Have your blood work drawn in the next day or 2. 2. We encouraged her patient's to be a low-fat diet and get plenty of exercise including but 30 minutes a day. This does not need to be vigorous exercises at the gym, but can consist of walking around the block, mowing the yard, etc. 3. Return for further evaluation in 6 months.   Fat and Cholesterol Control Diet Fat and cholesterol levels in your blood and organs are influenced by your diet. High levels of fat and cholesterol may lead to diseases of the heart, small and large blood vessels, gallbladder, liver, and pancreas. CONTROLLING FAT AND CHOLESTEROL WITH DIET Although exercise and lifestyle factors are important, your diet is key. That is because certain foods are known to raise cholesterol and others to lower it. The goal is to balance foods for their effect on cholesterol and more importantly, to replace saturated and trans fat with other types of fat, such as monounsaturated fat, polyunsaturated fat, and omega-3 fatty acids. On average, a person should consume no more than 15 to 17 g of saturated fat daily. Saturated and trans fats are considered "bad" fats, and they will raise LDL cholesterol. Saturated fats are primarily found in animal products such as meats, butter, and cream. However, that does not mean you need to give up all your favorite foods. Today, there are good tasting, low-fat, low-cholesterol substitutes for most of the things you like to eat. Choose low-fat or nonfat alternatives. Choose round or loin cuts of red meat. These types of cuts are lowest in fat and cholesterol. Chicken (without the skin), fish, veal, and ground [place] breast are great choices. Eliminate fatty meats, such as hot dogs and salami. Even shellfish have little or no saturated fat. Have a 3 oz (85 g) portion when you eat lean meat, poultry, or fish. Trans fats are also called "partially hydrogenated oils." They are oils that have been scientifically  manipulated so that they are solid at room temperature resulting in a longer shelf life and improved taste and texture of foods in which they are added. Trans fats are found in stick margarine, some tub margarines, cookies, crackers, and baked goods.  When baking and cooking, oils are a great substitute for butter. The monounsaturated oils are especially beneficial since it is believed they lower LDL and raise HDL. The oils you should avoid entirely are saturated tropical oils, such as coconut and palm.  Remember to eat a lot from food groups that are naturally free of saturated and trans fat, including fish, fruit, vegetables, beans, grains (barley, rice, couscous, bulgur wheat), and pasta (without cream sauces).  IDENTIFYING FOODS THAT LOWER FAT AND CHOLESTEROL  Soluble fiber may lower your cholesterol. This type of fiber is found in fruits such as apples, vegetables such as broccoli, potatoes, and carrots, legumes such as beans, peas, and lentils, and grains such as barley. Foods fortified with plant sterols (phytosterol) may also lower cholesterol. You should eat at least 2 g per day of these foods for a cholesterol lowering effect.  Read package labels to identify low-saturated fats, trans fat free, and low-fat foods at the supermarket. Select cheeses that have only 2 to 3 g saturated fat per ounce. Use a heart-healthy tub margarine that is free of trans fats or partially hydrogenated oil. When buying baked goods (cookies, crackers), avoid partially hydrogenated oils. Breads and muffins should be made from whole grains (whole-wheat or whole oat flour, instead of "flour" or "enriched flour").  Buy non-creamy canned soups with reduced salt and no added fats.  FOOD PREPARATION TECHNIQUES  Never deep-fry. If you must fry, either stir-fry, which uses very little fat, or use non-stick cooking sprays. When possible, broil, bake, or roast meats, and steam vegetables. Instead of putting butter or margarine on  vegetables, use lemon and herbs, applesauce, and cinnamon (for squash and sweet potatoes). Use nonfat yogurt, salsa, and low-fat dressings for salads.  LOW-SATURATED FAT / LOW-FAT FOOD SUBSTITUTES Meats / Saturated Fat (g)  Avoid: Steak, marbled (3 oz/85 g) / 11 g  Choose: Steak, lean (3 oz/85 g) / 4 g  Avoid: Hamburger (3 oz/85 g) / 7 g  Choose: Hamburger, lean (3 oz/85 g) / 5 g  Avoid: Ham (3 oz/85 g) / 6 g  Choose: Ham, lean cut (3 oz/85 g) / 2.4 g  Avoid: Chicken, with skin, dark meat (3 oz/85 g) / 4 g  Choose: Chicken, skin removed, dark meat (3 oz/85 g) / 2 g  Avoid: Chicken, with skin, light meat (3 oz/85 g) / 2.5 g  Choose: Chicken, skin removed, light meat (3 oz/85 g) / 1 g Dairy / Saturated Fat (g)  Avoid: Whole milk (1 cup) / 5 g  Choose: Low-fat milk, 2% (1 cup) / 3 g  Choose: Low-fat milk, 1% (1 cup) / 1.5 g  Choose: Skim milk (1 cup) / 0.3 g  Avoid: Hard cheese (1 oz/28 g) / 6 g  Choose: Skim milk cheese (1 oz/28 g) / 2 to 3 g  Avoid: Cottage cheese, 4% fat (1 cup) / 6.5 g  Choose: Low-fat cottage cheese, 1% fat (1 cup) / 1.5 g  Avoid: Ice cream (1 cup) / 9 g  Choose: Sherbet (1 cup) / 2.5 g  Choose: Nonfat frozen yogurt (1 cup) / 0.3 g  Choose: Frozen fruit bar / trace  Avoid: Whipped cream (1 tbs) / 3.5 g  Choose: Nondairy whipped topping (1 tbs) / 1 g Condiments / Saturated Fat (g)  Avoid: Mayonnaise (1 tbs) / 2 g  Choose: Low-fat mayonnaise (1 tbs) / 1 g  Avoid: Butter (1 tbs) / 7 g  Choose: Extra light margarine (1 tbs) / 1 g  Avoid: Coconut oil (1 tbs) / 11.8 g  Choose: Olive oil (1 tbs) / 1.8 g  Choose: Corn oil (1 tbs) / 1.7 g  Choose: Safflower oil (1 tbs) / 1.2 g  Choose: Sunflower oil (1 tbs) / 1.4 g  Choose: Soybean oil (1 tbs) / 2.4 g  Choose: Canola oil (1 tbs) / 1 g Document Released: 03/01/2005 Document Revised: 06/26/2012 Document Reviewed: 05/30/2013 [employer] Patient Information 2015 [place], [place]. This  information is not intended to replace advice given to you by your health care provider. Make sure you discuss any questions you have with your health care provider.    Exercise to Lose Weight Exercise and a healthy diet may help you lose weight. Your doctor may suggest specific exercises. EXERCISE IDEAS AND TIPS  Choose low-cost things you enjoy doing, such as walking, bicycling, or exercising to workout videos.  Take stairs instead of the elevator.  Walk during your lunch break.  Park your car further away from work or school.  Go to a gym or an exercise class.  Start with 5 to 10 minutes of exercise each day. Build up to 30 minutes of exercise 4 to 6 days a week.  Wear shoes with good support and comfortable clothes.  Stretch before and after  working out.  Work out until you breathe harder and your heart beats faster.  Drink extra water when you exercise.  Do not do so much that you hurt yourself, feel dizzy, or get very short of breath. Exercises that burn about 150 calories:  Running 1  miles in 15 minutes.  Playing volleyball for 45 to 60 minutes.  Washing and waxing a car for 45 to 60 minutes.  Playing touch football for 45 minutes.  Walking 1  miles in 35 minutes.  Pushing a stroller 1  miles in 30 minutes.  Playing basketball for 30 minutes.  Raking leaves for 30 minutes.  Bicycling 5 miles in 30 minutes.  Walking 2 miles in 30 minutes.  Dancing for 30 minutes.  Shoveling snow for 15 minutes.  Swimming laps for 20 minutes.  Walking up stairs for 15 minutes.  Bicycling 4 miles in 15 minutes.  Gardening for 30 to 45 minutes.  Jumping rope for 15 minutes.  Washing windows or floors for 45 to 60 minutes. Document Released: 04/03/2010 Document Revised: 05/24/2011 Document Reviewed: 04/03/2010 [facility] Patient Information 2015 [place], [place]. This information is not intended to replace advice given to you by your health care provider. Make  sure you discuss any questions you have with your health care provider.

## 2014-10-02 NOTE — Progress Notes (Signed)
CC'ED TO PCP 

## 2014-11-15 ENCOUNTER — Encounter: Payer: Self-pay | Admitting: Nurse Practitioner

## 2015-04-03 ENCOUNTER — Ambulatory Visit: Payer: Medicare Other | Admitting: Nurse Practitioner

## 2015-04-03 ENCOUNTER — Telehealth: Payer: Self-pay | Admitting: Nurse Practitioner

## 2015-04-03 NOTE — Telephone Encounter (Signed)
Pt ws a no show

## 2015-04-07 NOTE — Telephone Encounter (Signed)
Noted  

## 2015-04-10 ENCOUNTER — Encounter: Payer: Self-pay | Admitting: Nurse Practitioner

## 2015-04-10 ENCOUNTER — Ambulatory Visit (INDEPENDENT_AMBULATORY_CARE_PROVIDER_SITE_OTHER): Payer: Medicare Other | Admitting: Nurse Practitioner

## 2015-04-10 VITALS — BP 140/90 | HR 86 | Temp 97.4°F | Ht 71.0 in | Wt 222.6 lb

## 2015-04-10 DIAGNOSIS — R7989 Other specified abnormal findings of blood chemistry: Secondary | ICD-10-CM | POA: Diagnosis not present

## 2015-04-10 DIAGNOSIS — R945 Abnormal results of liver function studies: Principal | ICD-10-CM

## 2015-04-10 NOTE — Assessment & Plan Note (Signed)
Since he was initially evaluated with the 6 months ago, no labs could be found including labs we have ordered that have shown elevated liver function tests. He is on a statin although it does not appear to be affecting his liver. His hepatic hemangiomas which is been evaluated by MRI and deemed benign. Recommended keeping his blood sugar under control, cholesterol and triglycerides under control, and blood pressure under control to prevent metabolic syndrome which could lead to fatty liver and eventual liver problems. Return for follow-up as needed.

## 2015-04-10 NOTE — Progress Notes (Signed)
Referring Provider: Avelino Leeds* Primary Care Physician:  Mackey Birchwood Primary GI:  Dr. Gala Romney  Chief Complaint  Patient presents with  . Follow-up    HPI:   60 year old male presents for follow-up on elevated LFTs. Last seen in our office 10/02/2014 at which point he was feeling great and asymptomatic from a GI standpoint. BC, hep C antibody, and hepatic function panel in the previous April were all normal. Noted multiple hepatic hemangiomas on MRI. His LFTs were normal on every lab test results that could be found. Recommended diet and exercise prevent fatty liver. Follow-up in 6 months. Hepatic function panel ordered after that visit was completely normal specifically AST/ALT, albumin, alk phosphatase, total bilirubin, direct bilirubin, indirect bilirubin.  Today he states he's feeling good. Just saw PCP who checked labs and liver function was normal. Only abnormality was Hgb a1c which was around 6 and he was told she wants it lower. Recommended diet changes and increased exercise. Denies abdominal pain, N/V, generalized pruritis, yellowing of skin/eyes, darkened urine, episodic confusion. Denies chest pain, dyspnea, dizziness, lightheadedness, syncope, near syncope. Denies any other upper or lower GI symptoms.  Past Medical History  Diagnosis Date  . Hypertension   . Depression   . Hypercholesteremia   . Stroke [facility]) 2003    hemorrhagic stroke    Past Surgical History  Procedure Laterality Date  . Knee surgery      right  . Colonoscopy N/A 07/22/2014    RMR: anal papilla and internal hemorrhoids    Current Outpatient Prescriptions  Medication Sig Dispense Refill  . amLODipine-benazepril (LOTREL) 10-20 MG per capsule Take 1 capsule by mouth daily.    Marland Kitchen aspirin EC 81 MG tablet Take 81 mg by mouth daily.    Marland Kitchen atorvastatin (LIPITOR) 40 MG tablet Take 40 mg by mouth daily.     . citalopram (CELEXA) 20 MG tablet Take 20 mg by mouth daily.    . traZODone  (DESYREL) 100 MG tablet Take 100 mg by mouth at bedtime.    . naproxen (NAPROSYN) 500 MG tablet Take 1 tablet (500 mg total) by mouth 2 (two) times daily. (Patient not taking: Reported on 04/10/2015) 30 tablet 0   No current facility-administered medications for this visit.    Allergies as of 04/10/2015 - Review Complete 04/10/2015  Allergen Reaction Noted  . Bee venom Anaphylaxis 11/23/2011  . Oxycodone  11/23/2011    Family History  Problem Relation Age of Onset  . Stroke Mother   . Cancer Mother   . Hypertension Father   . Colon cancer Father     "My dad may have, but my memory isn't great."    Social History   Social History  . Marital Status: Divorced    Spouse Name: N/A  . Number of Children: N/A  . Years of Education: N/A   Social History Main Topics  . Smoking status: Current Every Day Smoker -- 1.00 packs/day for 41 years    Types: Cigarettes  . Smokeless tobacco: Former Systems developer  . Alcohol Use: 0.0 oz/week    0 Standard drinks or equivalent per week     Comment: occassionally/socially on Sundays for football games 1-2 beer  . Drug Use: No  . Sexual Activity: Not Asked   Other Topics Concern  . None   Social History Narrative    Review of Systems: General: Negative for anorexia, weight loss, fever, chills, fatigue, weakness. ENT: Negative for hoarseness, difficulty swallowing. CV:  Negative for chest pain, angina, palpitations, peripheral edema.  Respiratory: Negative for dyspnea at rest, cough, sputum, wheezing.  GI: See history of present illness. Endo: Negative for unusual weight change.  Heme: Negative for bruising or bleeding. Allergy: Negative for rash or hives.   Physical Exam: BP 140/90 mmHg  Pulse 86  Temp(Src) 97.4 F (36.3 C) (Oral)  Ht 5' 11"  (1.803 m)  Wt 222 lb 9.6 oz (100.971 kg)  BMI 31.06 kg/m2 General:   Alert and oriented. Pleasant and cooperative. Well-nourished and well-developed.  Head:  Normocephalic and atraumatic. Eyes:   Without icterus, sclera clear and conjunctiva pink.  Ears:  Normal auditory acuity. Cardiovascular:  S1, S2 present without murmurs appreciated. Extremities without clubbing or edema. Respiratory:  Clear to auscultation bilaterally. No wheezes, rales, or rhonchi. No distress.  Gastrointestinal:  +BS, rounded but soft, non-tender and non-distended. No HSM noted. No guarding or rebound. No masses appreciated.  Rectal:  Deferred  Musculoskalatal:  Symmetrical without gross deformities. Skin:  Intact without significant lesions or rashes. Neurologic:  Alert and oriented x4;  grossly normal neurologically. Psych:  Alert and cooperative. Normal mood and affect. Heme/Lymph/Immune: No excessive bruising noted.    04/10/2015 11:40 AM

## 2015-04-10 NOTE — Patient Instructions (Signed)
1. As we discussed I could not find any incidence of your liver enzymes being elevated. There were just checked last month and again normal. 2. Keep your blood pressure, cholesterol, triglycerides, and blood sugar under control to help prevent fatty liver. 3. Try to integrate more exercise into your day. As we discussed you can park at the back of the parking lot, go to a store to walk around when it's too cold to walk outside, etc. Try to make little changes to make a difference. 4. Follow-up as needed for any new or recurrent GI symptoms.

## 2015-04-10 NOTE — Progress Notes (Signed)
cc'ed to pcp °

## 2019-05-01 ENCOUNTER — Emergency Department (HOSPITAL_COMMUNITY): Payer: Medicare Other

## 2019-05-01 ENCOUNTER — Encounter (HOSPITAL_COMMUNITY): Payer: Self-pay

## 2019-05-01 ENCOUNTER — Other Ambulatory Visit: Payer: Self-pay

## 2019-05-01 ENCOUNTER — Emergency Department (HOSPITAL_COMMUNITY)
Admission: EM | Admit: 2019-05-01 | Discharge: 2019-05-01 | Disposition: A | Discharge status: Home / Self Care | Payer: Medicare Other | Attending: Emergency Medicine | Admitting: Emergency Medicine

## 2019-05-01 DIAGNOSIS — I10 Essential (primary) hypertension: Secondary | ICD-10-CM | POA: Diagnosis not present

## 2019-05-01 DIAGNOSIS — M48061 Spinal stenosis, lumbar region without neurogenic claudication: Secondary | ICD-10-CM | POA: Insufficient documentation

## 2019-05-01 DIAGNOSIS — F17210 Nicotine dependence, cigarettes, uncomplicated: Secondary | ICD-10-CM | POA: Insufficient documentation

## 2019-05-01 DIAGNOSIS — M79606 Pain in leg, unspecified: Secondary | ICD-10-CM | POA: Diagnosis present

## 2019-05-01 HISTORY — DX: Unspecified osteoarthritis, unspecified site: M19.90

## 2019-05-01 LAB — BASIC METABOLIC PANEL
Anion gap: 8 (ref 5–15)
BUN: 9 mg/dL (ref 8–23)
CO2: 24 mmol/L (ref 22–32)
Calcium: 9.2 mg/dL (ref 8.9–10.3)
Chloride: 106 mmol/L (ref 98–111)
Creatinine, Ser: 0.84 mg/dL (ref 0.61–1.24)
GFR calc Af Amer: >60 mL/min (ref >60)
GFR calc non Af Amer: >60 mL/min (ref >60)
Glucose, Bld: 128 mg/dL — ABNORMAL HIGH (ref 70–99)
Potassium: 3.9 mmol/L (ref 3.5–5.1)
Sodium: 138 mmol/L (ref 135–145)

## 2019-05-01 LAB — CK: Total CK: 114 U/L (ref 49–397)

## 2019-05-01 LAB — CBC
HCT: 43 % (ref 39.0–52.0)
Hemoglobin: 13.8 g/dL (ref 13.0–17.0)
MCH: 29.7 pg (ref 26.0–34.0)
MCHC: 32.1 g/dL (ref 30.0–36.0)
MCV: 92.7 fL (ref 80.0–100.0)
Platelets: 323 10*3/uL (ref 150–400)
RBC: 4.64 MIL/uL (ref 4.22–5.81)
RDW: 12.7 % (ref 11.5–15.5)
WBC: 6.1 10*3/uL (ref 4.0–10.5)
nRBC: 0 % (ref 0.0–0.2)

## 2019-05-01 MED ORDER — METHOCARBAMOL 500 MG PO TABS: 500.0000 mg | ORAL_TABLET | Freq: Three times a day (TID) | ORAL | 0 refills | Status: AC | PRN

## 2019-05-01 MED ORDER — PREDNISONE 10 MG PO TABS: ORAL_TABLET | ORAL | 0 refills | Status: AC

## 2019-05-01 MED ORDER — ACETAMINOPHEN 500 MG PO TABS
1000.0000 mg | ORAL_TABLET | Freq: Once | ORAL | Status: AC
  Administered 2019-05-01: 10:00:00 1000 mg via ORAL
  Filled 2019-05-01: qty 2

## 2019-05-01 MED ORDER — METHOCARBAMOL 500 MG PO TABS
1000.0000 mg | ORAL_TABLET | Freq: Once | ORAL | Status: AC
  Administered 2019-05-01: 1000 mg via ORAL
  Filled 2019-05-01: qty 2

## 2019-05-01 NOTE — Discharge Instructions (Signed)
You were evaluated in the Emergency Department and after careful evaluation, we did not find any emergent condition requiring admission or further testing in the hospital.  Your symptoms seem to be due to lumbar stenosis.  Please take the prednisone medication as directed.  You can use the Robaxin muscle relaxer as needed for pain as well.  It is important that you follow-up with the neurosurgeons.  Please call the office to schedule an appointment.  Please return to the Emergency Department if you experience any worsening of your condition.  We encourage you to follow up with a primary care provider.  Thank you for allowing [place] to be a part of your care.

## 2019-05-01 NOTE — ED Triage Notes (Addendum)
Pt reports bilateral leg pain for almost one month. Reports pain can be from buttocks down leg and in upper thighs . Pt reports relief has some of pain once he uses the bathroom. Has difficulty standing and walking. No bowel or bladder incontinence reported. Also bilateral swelling of knees

## 2019-05-01 NOTE — ED Provider Notes (Signed)
[facility] Emergency Department Provider Note MRN:  [id number]  Arrival date & time: 05/01/19     Chief Complaint   Leg Pain   History of Present Illness   Evan Rios is a 64 y.o. year-old male with a history of hypertension, arthritis, hemorrhagic stroke presenting to the ED with chief complaint of leg pain.  1 month of progressively worsening bilateral leg pain.  1 month ago patient was ambulating easily and without issue.  Now can barely walk.  Pain is in the bilateral upper legs, also endorses low back pain fairly constantly which is chronic.  Pain is worst in the left anterior thigh at rest, worst in the bilateral posterior upper legs with attempted ambulation.  Severe functional decline, now barely able to walk at home.  Denies fever, no cough, no chest pain, no shortness of breath, endorsing paresthesias to the back of the legs bilaterally.  Denies weakness.  No bowel or bladder dysfunction, but explains that his pain is relieved when he has a bowel movement or urinates.  Review of Systems  A complete 10 system review of systems was obtained and all systems are negative except as noted in the HPI and PMH.   Patient's Health History    Past Medical History:  Diagnosis Date  . Arthritis   . Depression   . Hypercholesteremia   . Hypertension   . Stroke [facility]) 2003   hemorrhagic stroke    Past Surgical History:  Procedure Laterality Date  . COLONOSCOPY N/A 07/22/2014   RMR: anal papilla and internal hemorrhoids  . KNEE SURGERY     right    Family History  Problem Relation Age of Onset  . Stroke Mother   . Cancer Mother   . Hypertension Father   . Colon cancer Father        "My dad may have, but my memory isn't great."    Social History   Socioeconomic History  . Marital status: Divorced    Spouse name: Not on file  . Number of children: Not on file  . Years of education: Not on file  . Highest education level: Not on file  Occupational  History  . Not on file  Tobacco Use  . Smoking status: Current Every Day Smoker    Packs/day: 1.00    Years: 41.00    Pack years: 41.00    Types: Cigarettes  . Smokeless tobacco: Former Network engineer and Sexual Activity  . Alcohol use: Yes    Alcohol/week: 0.0 standard drinks    Comment: occassionally/socially on Sundays for football games 1-2 beer  . Drug use: No  . Sexual activity: Not on file  Other Topics Concern  . Not on file  Social History Narrative  . Not on file   Social Determinants of Health   Financial Resource Strain:   . Difficulty of Paying Living Expenses: Not on file  Food Insecurity:   . Worried About Charity fundraiser in the Last Year: Not on file  . Ran Out of Food in the Last Year: Not on file  Transportation Needs:   . Lack of Transportation (Medical): Not on file  . Lack of Transportation (Non-Medical): Not on file  Physical Activity:   . Days of Exercise per Week: Not on file  . Minutes of Exercise per Session: Not on file  Stress:   . Feeling of Stress : Not on file  Social Connections:   . Frequency of Communication with  Friends and Family: Not on file  . Frequency of Social Gatherings with Friends and Family: Not on file  . Attends Religious Services: Not on file  . Active Member of Clubs or Organizations: Not on file  . Attends Archivist Meetings: Not on file  . Marital Status: Not on file  Intimate Partner Violence:   . Fear of Current or Ex-Partner: Not on file  . Emotionally Abused: Not on file  . Physically Abused: Not on file  . Sexually Abused: Not on file     Physical Exam   Vitals:   05/01/19 0836 05/01/19 0840  BP: (!) 153/103   Pulse: 98   Resp: 18   Temp:  98.2 F (36.8 C)  SpO2: 99%     CONSTITUTIONAL: Well-appearing, NAD NEURO:  Alert and oriented x 3, no objective sensory or motor loss to the bilateral arms and legs, normal speech. EYES:  eyes equal and reactive ENT/NECK:  no LAD, no JVD CARDIO:  Regular rate, well-perfused, normal S1 and S2 PULM:  CTAB no wheezing or rhonchi GI/GU:  normal bowel sounds, non-distended, non-tender MSK/SPINE:  No gross deformities, no edema; severely limited and antalgic gait SKIN:  no rash, atraumatic PSYCH:  Appropriate speech and behavior  *Additional and/or pertinent findings included in MDM below  Diagnostic and Interventional Summary    EKG Interpretation  Date/Time:    Ventricular Rate:    PR Interval:    QRS Duration:   QT Interval:    QTC Calculation:   R Axis:     Text Interpretation:        Cardiac Monitoring Interpretation:  Labs Reviewed  BASIC METABOLIC PANEL - Abnormal; Notable for the following components:      Result Value   Glucose, Bld 128 (*)    All other components within normal limits  CBC  CK    MR LUMBAR SPINE WO CONTRAST  Final Result    MR THORACIC SPINE WO CONTRAST  Final Result    DG Lumbar Spine Complete  Final Result      Medications  methocarbamol (ROBAXIN) tablet 1,000 mg (1,000 mg Oral Given 05/01/19 1007)  acetaminophen (TYLENOL) tablet 1,000 mg (1,000 mg Oral Given 05/01/19 1007)     Procedures  /  Critical Care Procedures  ED Course and Medical Decision Making  I have reviewed the triage vital signs, the nursing notes, and pertinent available records from the EMR.  Pertinent labs & imaging results that were available during my care of the patient were reviewed by me and considered in my medical decision making (see below for details).     Progressive functional decline with low back pain, bilateral leg pain.  Will x-ray to exclude pathologic fracture, anticipating need for MRI to exclude myelopathy.  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  No indication for urgent intervention or emergent neurosurgical consultation.  Patient is appropriate for prednisone  taper and neurosurgery follow-up.  [name]. Sedonia Small, [place] [email or web address]  Final Clinical Impressions(s) / ED Diagnoses     ICD-10-CM   1. Spinal stenosis of lumbar region without neurogenic claudication  M48.061     ED Discharge Orders         Ordered    predniSONE (DELTASONE) 10 MG tablet     05/01/19 1211    methocarbamol (ROBAXIN) 500 MG  tablet  Every 8 hours PRN     05/01/19 1211           Discharge Instructions Discussed with and Provided to Patient:     Discharge Instructions     You were evaluated in the Emergency Department and after careful evaluation, we did not find any emergent condition requiring admission or further testing in the hospital.  Your symptoms seem to be due to lumbar stenosis.  Please take the prednisone medication as directed.  You can use the Robaxin muscle relaxer as needed for pain as well.  It is important that you follow-up with the neurosurgeons.  Please call the office to schedule an appointment.  Please return to the Emergency Department if you experience any worsening of your condition.  We encourage you to follow up with a primary care provider.  Thank you for allowing [place] to be a part of your care.       Maudie Flakes, MD 05/01/19 1213

## 2019-12-07 ENCOUNTER — Ambulatory Visit: Payer: Medicare Other | Admitting: Cardiology

## 2021-09-19 IMAGING — DX DG LUMBAR SPINE COMPLETE 4+V
5 series · 5 of 5 positions shown · non-contrast
Comparison: None.

CLINICAL DATA: Low back pain and bilateral lower extremity weakness
for 3 weeks. Prostate cancer

EXAM:
LUMBAR SPINE - COMPLETE 4+ VIEW

[l-spine ap]
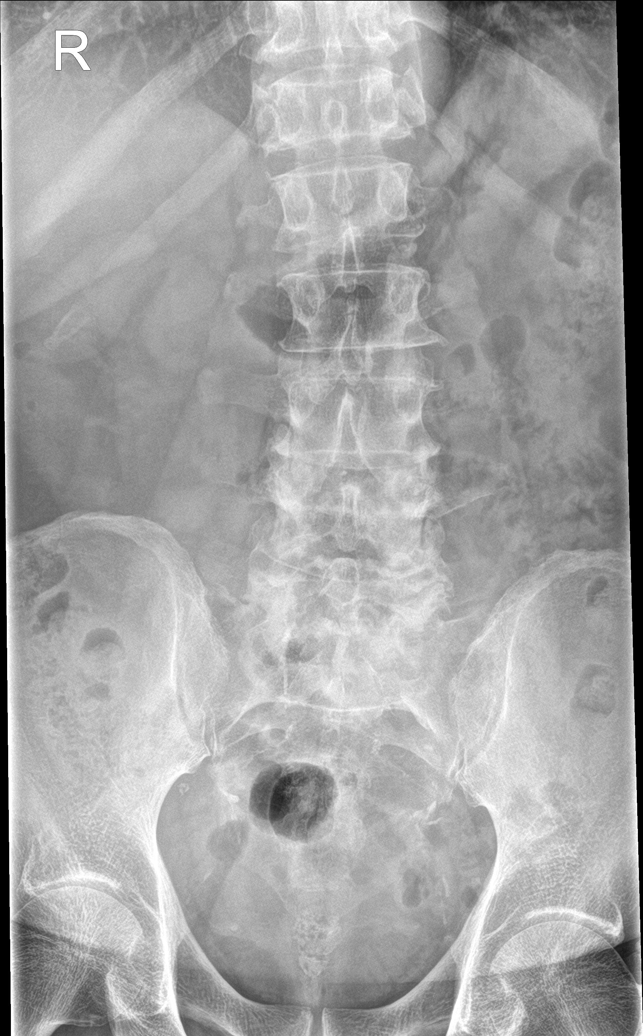

[l-spine obl (1 of 2)]
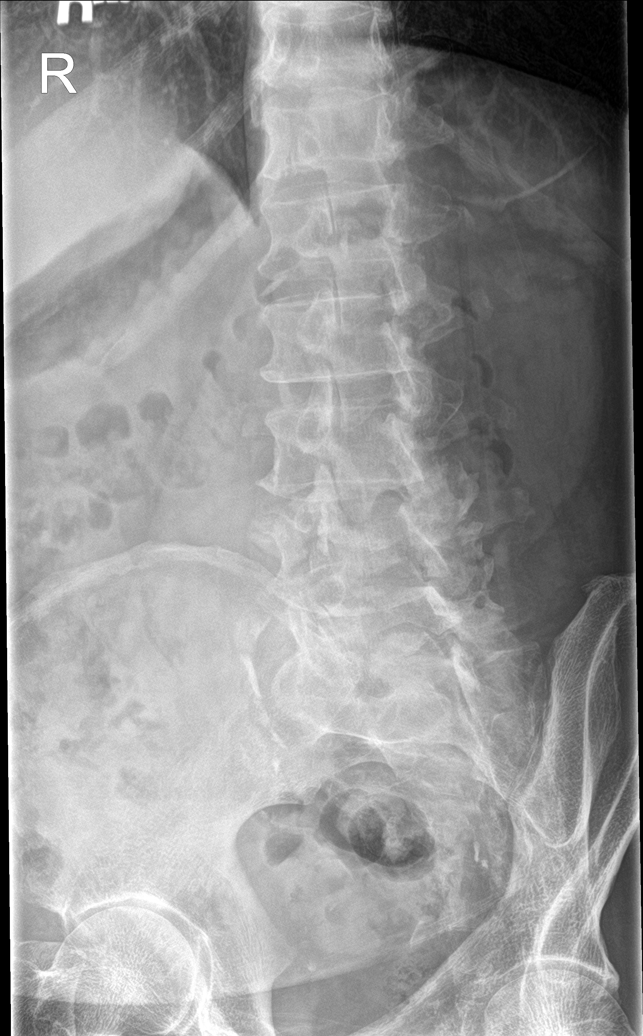

[l-spine obl (2 of 2)]
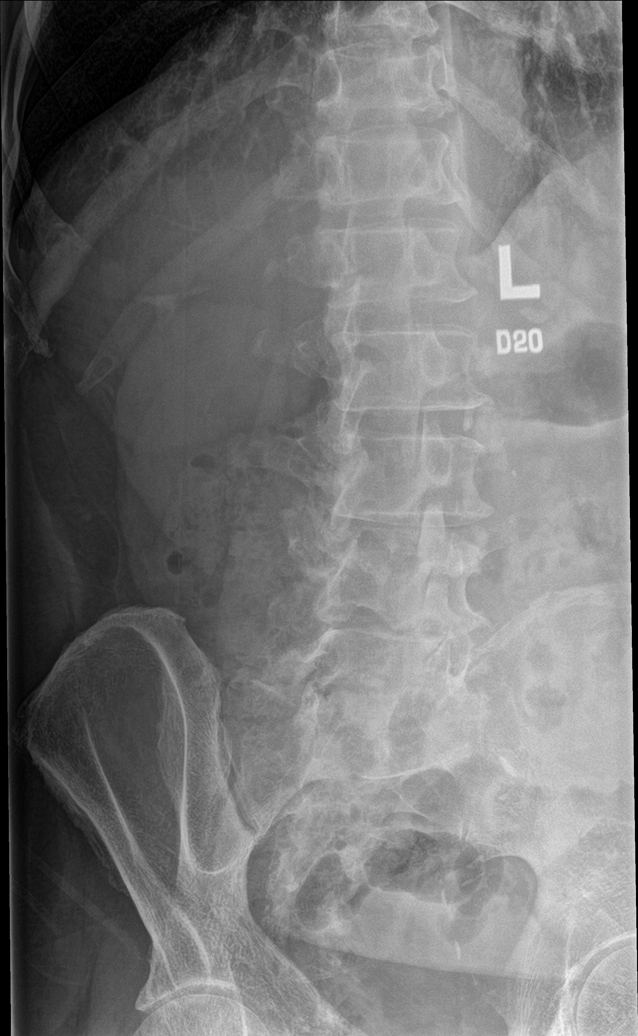

[l-spine lat]
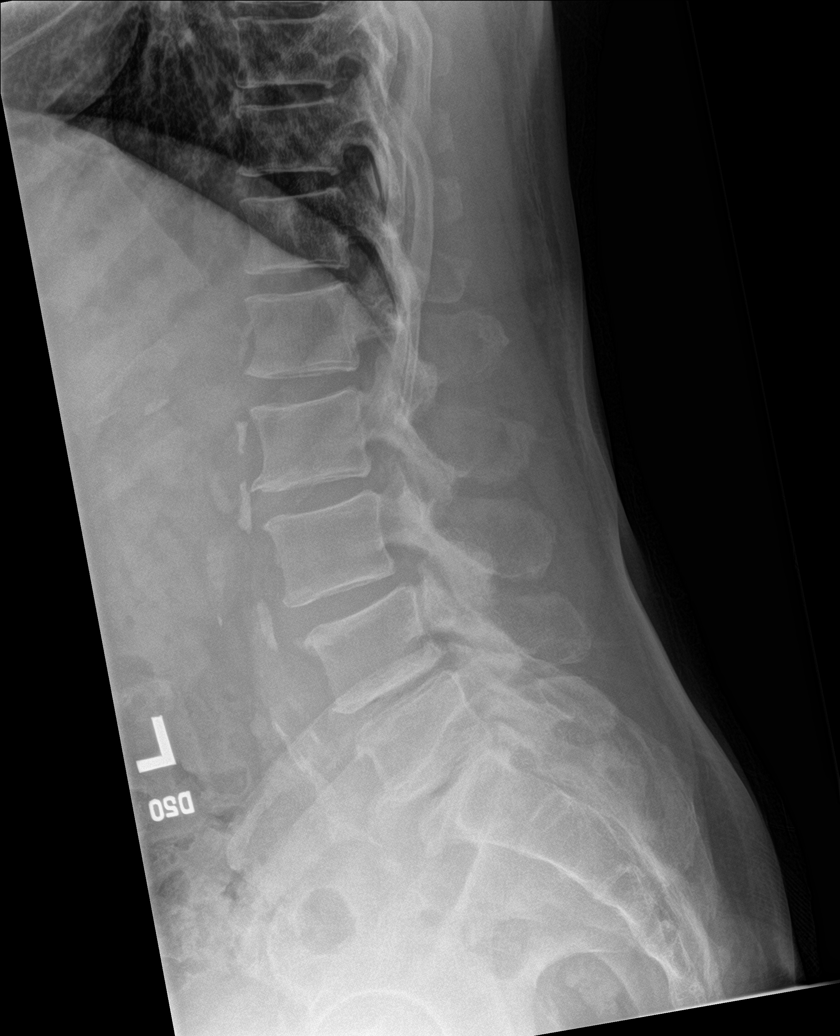

[l-spine spot]
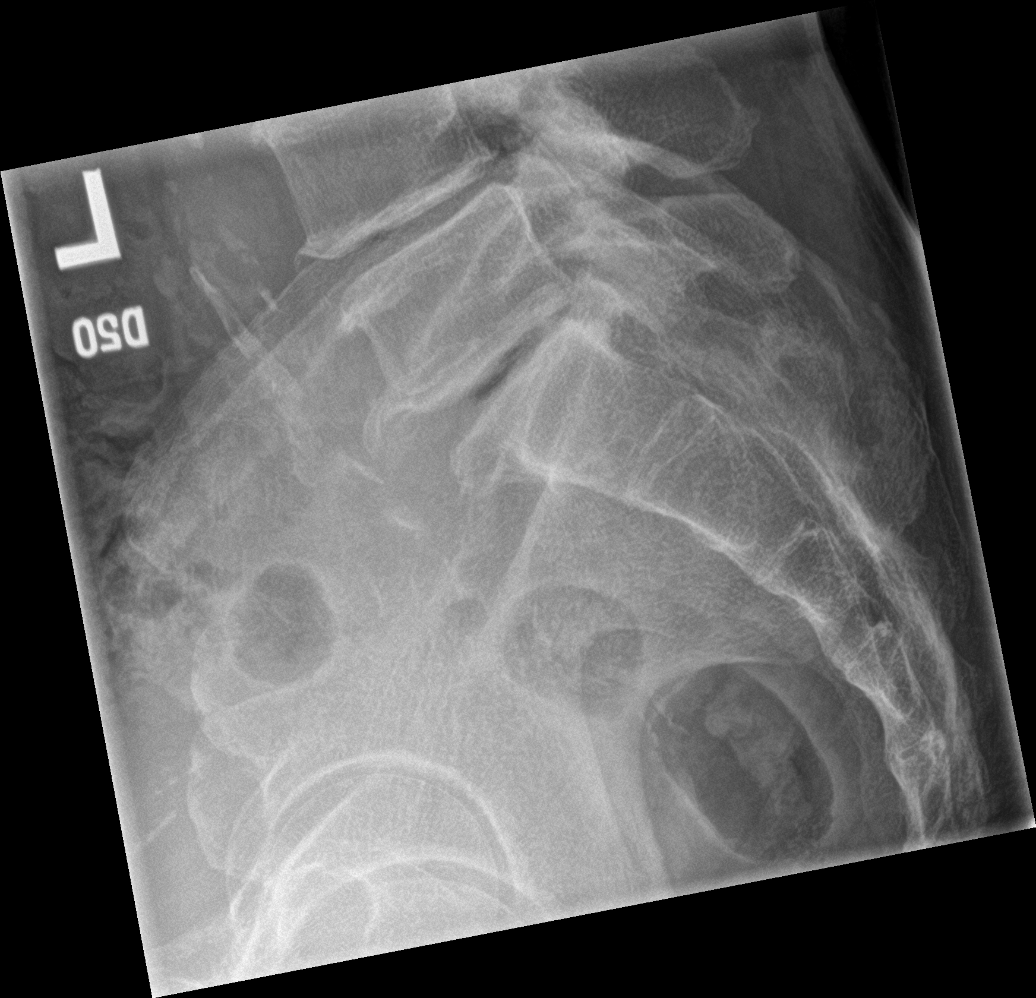

[5 of 5 positions shown; findings below may reference images not displayed]

FINDINGS: Five lumbar type vertebral segments. 4 mm grade 1 anterolisthesis L3
on L4. Intervertebral disc height loss and degenerative endplate
changes at L4-5 and L5-S1. Lower lumbar facet arthrosis. Vertebral
body heights are maintained without fracture. No lytic or sclerotic
osseous lesion identified. Prominent calcified atherosclerotic
plaques throughout the visualized abdominal aorta.
IMPRESSION: 1. No acute osseous abnormality identified.
2. Grade 1 anterolisthesis L3 on L4.
3. Mild-to-moderate lumbar spondylosis most pronounced at L4-5 and
L5-S1.

## 2024-02-24 NOTE — ED Notes (Signed)
 [facility] Emergency Department Attestation Note    ED Clinical Impression    Final diagnoses:  Chest pain, unspecified type (Primary)     ED Attending Physician Teaching Attestation    I supervised care provided by the resident. We have discussed the case, I have reviewed the note and I agree with the plan of treatment except as documented in my note.  I have personally performed a face-to-face diagnostic evaluation on this patient.   See chart and resident provider documentation for details.  Portions of this record have been created using Scientist, clinical (histocompatibility and immunogenetics). Dictation errors have been sought, but may not have been identified and corrected.    Additional Medical Decision Making   I have reviewed the patient's vital signs and the nursing notes. Any pertinent labs & imaging results which were available during my care of the patient were reviewed by me.   Evan Rios was evaluated in Emergency Department at the time of this visit for the symptoms described in the history of present illness. He was evaluated in the context of the global COVID-19 pandemic, which necessitated consideration that the patient might be at risk for infection with the SARS-CoV-2 virus that causes COVID-19. Institutional protocols and algorithms that pertain to the evaluation of patients at risk for COVID-19 were followed during the patient's care in the ED.

## 2024-03-26 ENCOUNTER — Encounter (INDEPENDENT_AMBULATORY_CARE_PROVIDER_SITE_OTHER): Payer: Self-pay | Admitting: *Deleted
